# Patient Record
Sex: Female | Born: 1990 | Race: Black or African American | Hispanic: No | Marital: Single | Smoking: Never smoker
Health system: Southern US, Community
[De-identification: ages and names within clinical notes are randomized; demographics above are authoritative.]

## PROBLEM LIST (undated history)

## (undated) DIAGNOSIS — T7840XA Allergy, unspecified, initial encounter: Secondary | ICD-10-CM

## (undated) DIAGNOSIS — F32A Depression, unspecified: Secondary | ICD-10-CM

## (undated) DIAGNOSIS — Z8489 Family history of other specified conditions: Secondary | ICD-10-CM

## (undated) DIAGNOSIS — D649 Anemia, unspecified: Secondary | ICD-10-CM

## (undated) DIAGNOSIS — F419 Anxiety disorder, unspecified: Secondary | ICD-10-CM

## (undated) DIAGNOSIS — K8020 Calculus of gallbladder without cholecystitis without obstruction: Secondary | ICD-10-CM

## (undated) HISTORY — DX: Anemia, unspecified: D64.9

## (undated) HISTORY — DX: Depression, unspecified: F32.A

## (undated) HISTORY — DX: Anxiety disorder, unspecified: F41.9

## (undated) HISTORY — DX: Allergy, unspecified, initial encounter: T78.40XA

---

## 2008-01-02 ENCOUNTER — Ambulatory Visit: Payer: Self-pay | Admitting: Family Medicine

## 2008-01-02 DIAGNOSIS — E669 Obesity, unspecified: Secondary | ICD-10-CM

## 2008-01-02 DIAGNOSIS — N926 Irregular menstruation, unspecified: Secondary | ICD-10-CM | POA: Insufficient documentation

## 2008-01-04 ENCOUNTER — Encounter: Payer: Self-pay | Admitting: Family Medicine

## 2008-01-04 ENCOUNTER — Ambulatory Visit (HOSPITAL_COMMUNITY): Admission: RE | Admit: 2008-01-04 | Discharge: 2008-01-04 | Payer: Self-pay | Admitting: Family Medicine

## 2008-01-06 ENCOUNTER — Encounter: Payer: Self-pay | Admitting: Family Medicine

## 2008-01-06 LAB — CONVERTED CEMR LAB
Basophils Absolute: 0 10*3/uL (ref 0.0–0.1)
Basophils Relative: 0 % (ref 0–1)
CO2: 24 meq/L (ref 19–32)
Creatinine, Ser: 0.89 mg/dL (ref 0.40–1.20)
Eosinophils Absolute: 0.2 10*3/uL (ref 0.0–0.7)
Eosinophils Relative: 3 % (ref 0–5)
HCT: 35.8 % — ABNORMAL LOW (ref 36.0–46.0)
HDL: 47 mg/dL (ref >34)
Lymphocytes Relative: 40 % (ref 12–46)
Monocytes Absolute: 0.6 10*3/uL (ref 0.1–1.0)
Monocytes Relative: 10 % (ref 3–12)
Potassium: 4.6 meq/L (ref 3.5–5.3)
RBC: 3.9 M/uL (ref 3.87–5.11)
Total CHOL/HDL Ratio: 3.7
Triglycerides: 98 mg/dL (ref <150)
VLDL: 20 mg/dL (ref 0–40)
WBC: 5.4 10*3/uL (ref 4.0–10.5)

## 2009-05-24 ENCOUNTER — Emergency Department (HOSPITAL_COMMUNITY): Admission: EM | Admit: 2009-05-24 | Discharge: 2009-05-24 | Payer: Self-pay | Admitting: Emergency Medicine

## 2009-09-20 IMAGING — US US TRANSVAGINAL NON-OB
1 series · 14 of 25 positions shown · non-contrast
Comparison: None

CLINICAL DATA: Irregular menstrual cycle.  Polycystic ovarian
syndrome.

TRANSABDOMINAL AND TRANSVAGINAL ULTRASOUND OF PELVIS
TECHNIQUE: Both transabdominal and transvaginal ultrasound
examinations of the pelvis were performed including evaluation of
the uterus, ovaries, adnexal regions, and pelvic cul-de-sac.

[Series 1: us transvaginal non-ob · 0.26mm/px · 14 of 61 slices shown]
[im 1/61]
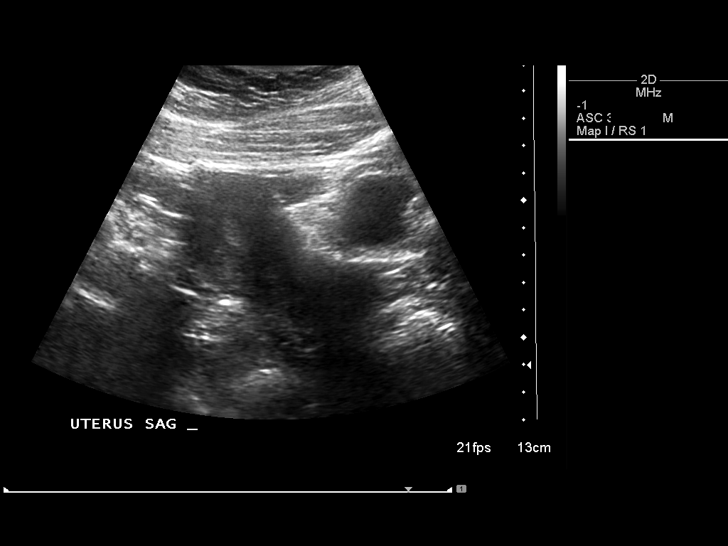
[im 6/61]
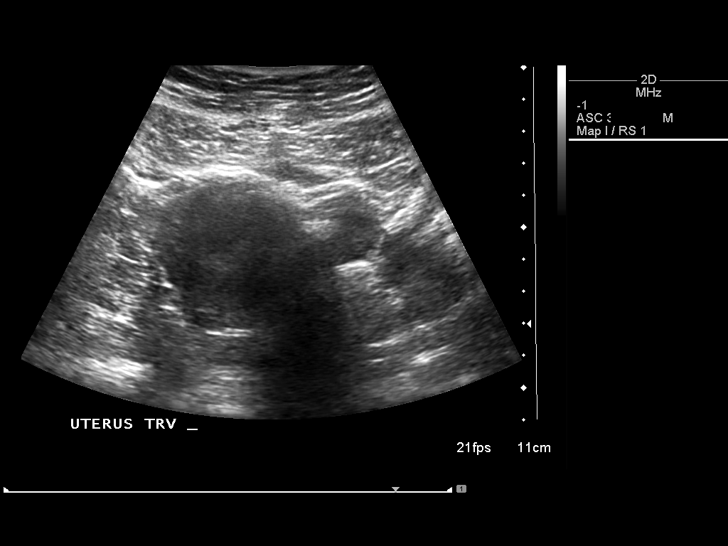
[im 11/61]
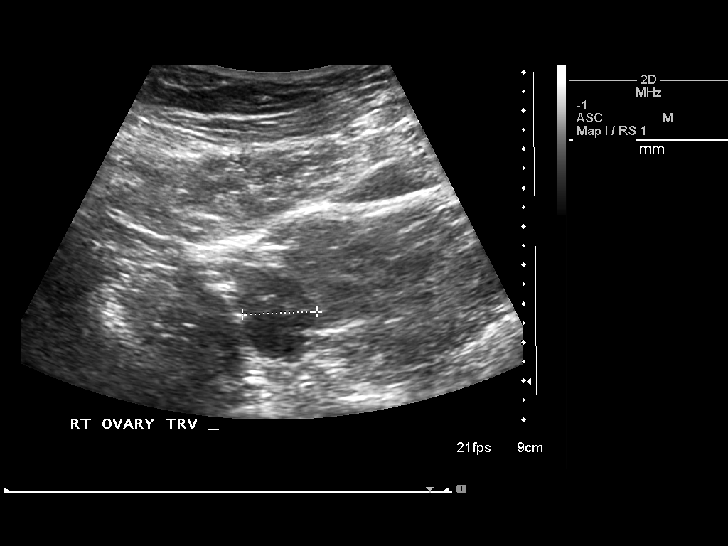
[im 16/61]
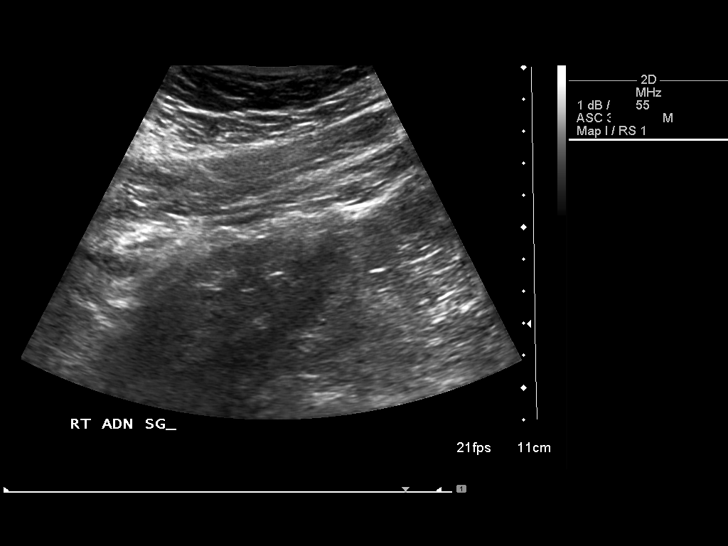
[im 21/61]
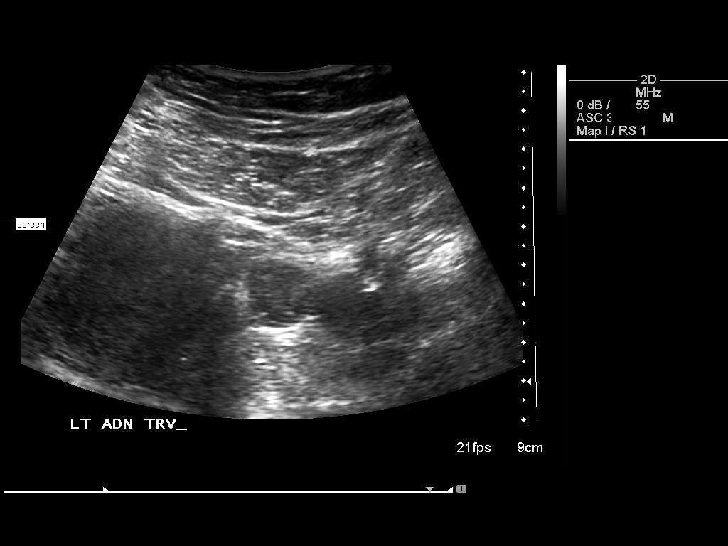
[im 23/61]
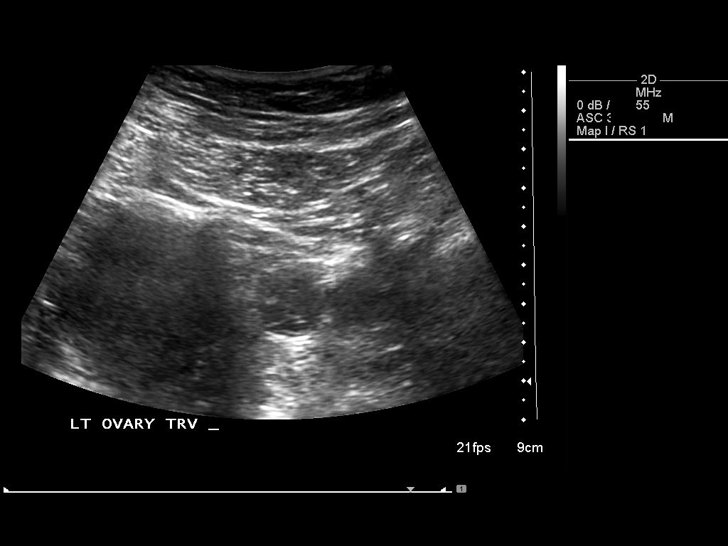
[im 28/61]
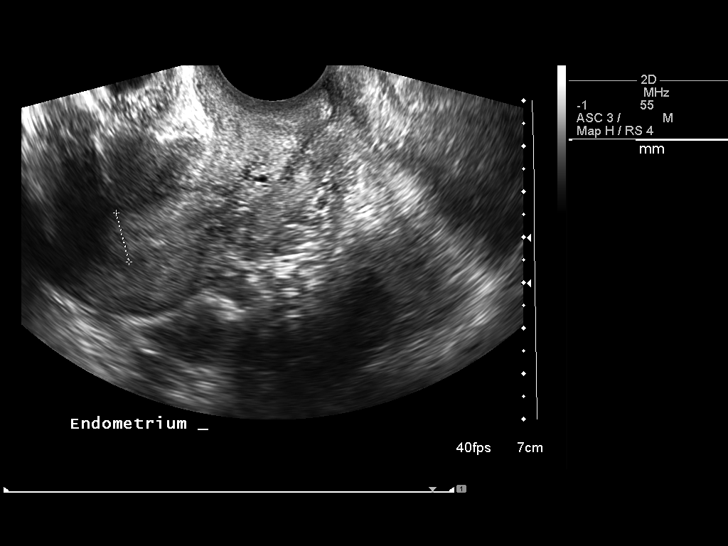
[im 33/61]
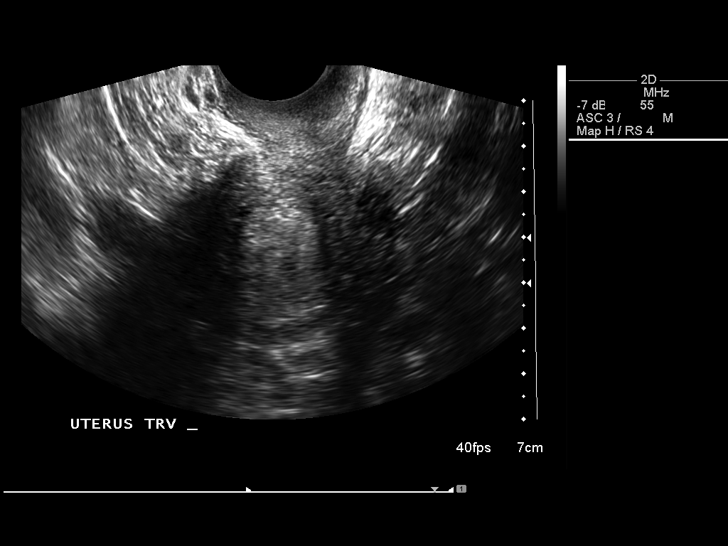
[im 38/61]
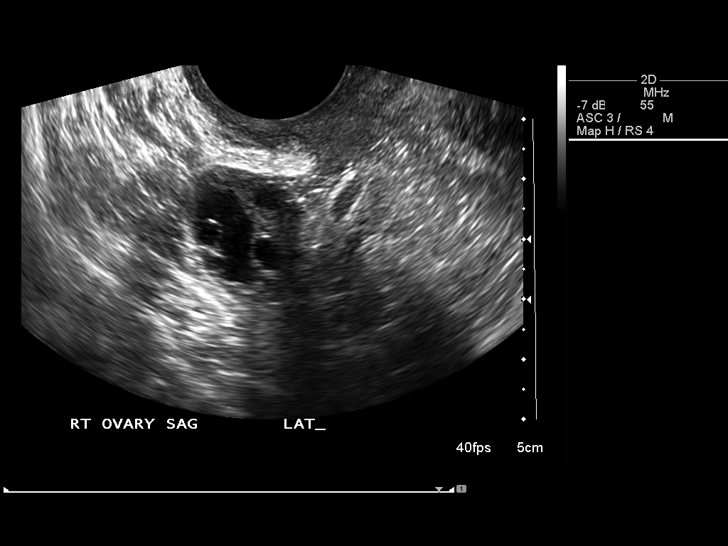
[im 41/61]
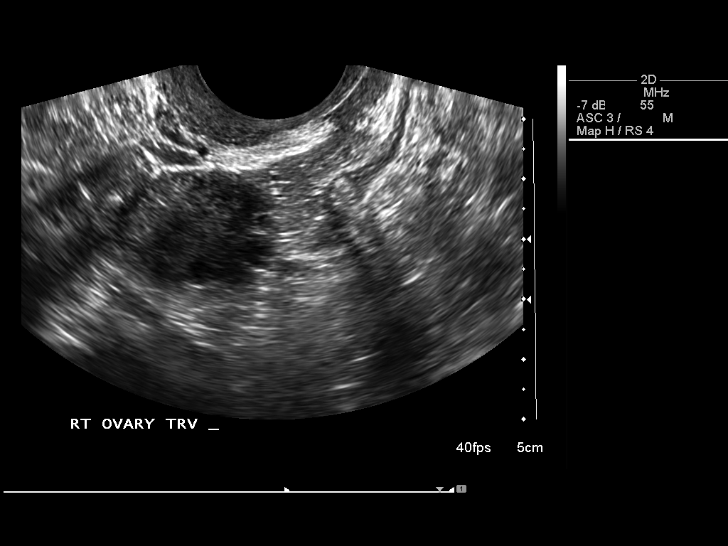
[im 46/61]
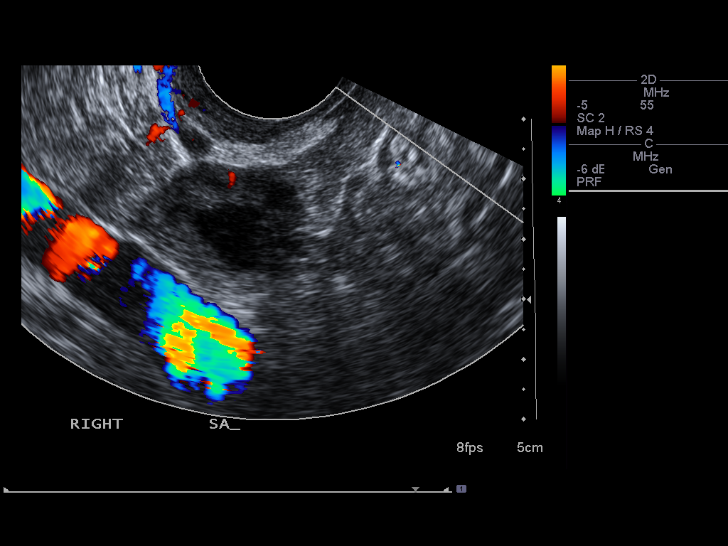
[im 51/61]
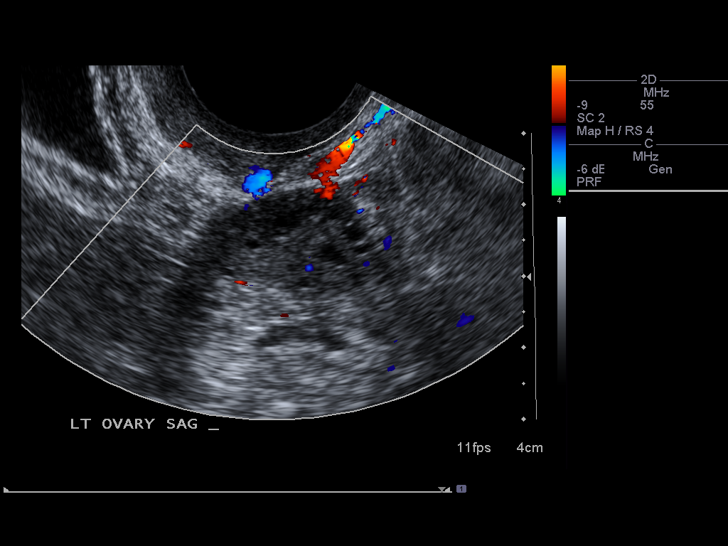
[im 56/61]
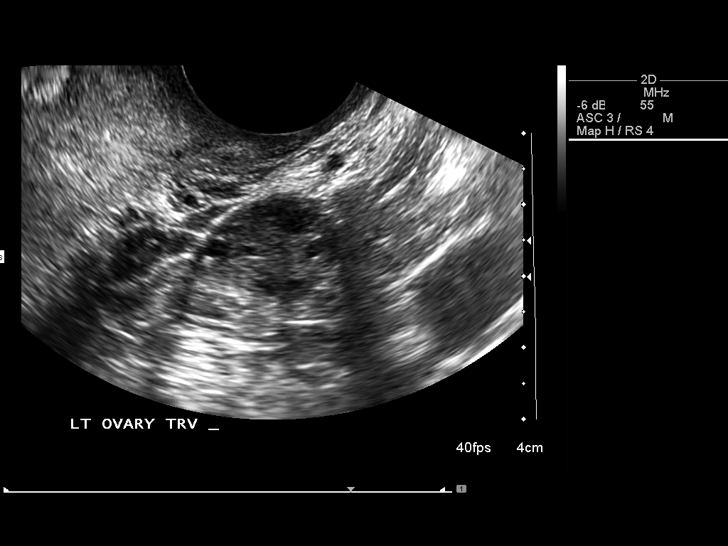
[im 61/61]
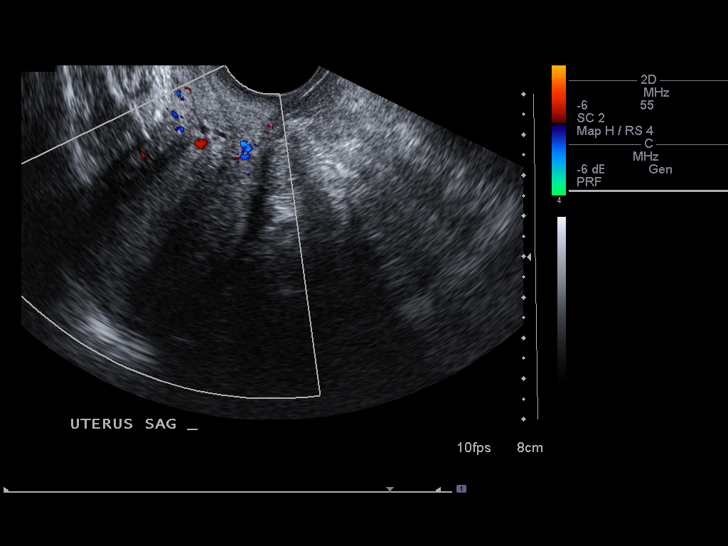

[14 of 25 positions shown; findings below may reference images not displayed]

FINDINGS: The uterus appears normal measuring 7.6 x 4.3 x 5.0 cm.

The endometrium appears normal measuring measuring 11 mm.

No intrauterine filling defects noted.

The left ovary is normal measuring 2.6 x 1.6 x 1.6 cm.

The right ovary measures 2.5 x 1.9 x 2.5 cm.

Minimally complex cyst within the right ovary measures 1.9 x 0.8 x
1.0 cm.

There is no free fluid within the pelvis.
IMPRESSION: 1.Mildly complex cyst within the right ovary measures 1.9 x 0.8 x
1.0 cm.

2.  The examination is otherwise unremarkable.

## 2010-08-05 ENCOUNTER — Other Ambulatory Visit: Payer: Self-pay | Admitting: Nurse Practitioner

## 2010-08-05 ENCOUNTER — Other Ambulatory Visit (HOSPITAL_COMMUNITY)
Admission: RE | Admit: 2010-08-05 | Discharge: 2010-08-05 | Disposition: A | Discharge status: Home / Self Care | Payer: Medicaid Other | Source: Ambulatory Visit | Attending: Unknown Physician Specialty | Admitting: Unknown Physician Specialty

## 2010-08-05 DIAGNOSIS — N870 Mild cervical dysplasia: Secondary | ICD-10-CM | POA: Insufficient documentation

## 2010-08-05 DIAGNOSIS — R87612 Low grade squamous intraepithelial lesion on cytologic smear of cervix (LGSIL): Secondary | ICD-10-CM | POA: Insufficient documentation

## 2010-08-05 DIAGNOSIS — B977 Papillomavirus as the cause of diseases classified elsewhere: Secondary | ICD-10-CM | POA: Insufficient documentation

## 2010-09-03 ENCOUNTER — Emergency Department (HOSPITAL_COMMUNITY)
Admission: EM | Admit: 2010-09-03 | Discharge: 2010-09-04 | Disposition: A | Discharge status: Home / Self Care | Payer: Medicaid Other | Attending: Emergency Medicine | Admitting: Emergency Medicine

## 2010-09-03 DIAGNOSIS — R109 Unspecified abdominal pain: Secondary | ICD-10-CM | POA: Insufficient documentation

## 2010-09-03 LAB — DIFFERENTIAL
Eosinophils Relative: 1 % (ref 0–5)
Lymphocytes Relative: 36 % (ref 12–46)
Neutro Abs: 3.6 10*3/uL (ref 1.7–7.7)
Neutrophils Relative %: 53 % (ref 43–77)

## 2010-09-03 LAB — CBC
HCT: 35 % — ABNORMAL LOW (ref 36.0–46.0)
MCH: 29.2 pg (ref 26.0–34.0)
MCHC: 32.6 g/dL (ref 30.0–36.0)

## 2010-09-03 LAB — HEPATIC FUNCTION PANEL
ALT: 63 U/L — ABNORMAL HIGH (ref 0–35)
Alkaline Phosphatase: 124 U/L — ABNORMAL HIGH (ref 39–117)
Bilirubin, Direct: 0.1 mg/dL (ref 0.0–0.3)
Total Bilirubin: 0.2 mg/dL — ABNORMAL LOW (ref 0.3–1.2)

## 2010-09-03 LAB — BASIC METABOLIC PANEL
Chloride: 102 mEq/L (ref 96–112)
Glucose, Bld: 88 mg/dL (ref 70–99)
Sodium: 138 mEq/L (ref 135–145)

## 2010-09-03 LAB — URINALYSIS, ROUTINE W REFLEX MICROSCOPIC
Bilirubin Urine: NEGATIVE
Glucose, UA: NEGATIVE mg/dL
Hgb urine dipstick: NEGATIVE
Ketones, ur: NEGATIVE mg/dL
Nitrite: NEGATIVE
Specific Gravity, Urine: 1.015 (ref 1.005–1.030)
pH: 8.5 — ABNORMAL HIGH (ref 5.0–8.0)

## 2010-09-03 LAB — POCT PREGNANCY, URINE: Preg Test, Ur: NEGATIVE

## 2010-09-04 ENCOUNTER — Ambulatory Visit (HOSPITAL_COMMUNITY)
Admit: 2010-09-04 | Discharge: 2010-09-04 | Disposition: A | Discharge status: Home / Self Care | Payer: Medicaid Other | Source: Ambulatory Visit | Attending: Emergency Medicine | Admitting: Emergency Medicine

## 2010-09-04 DIAGNOSIS — K838 Other specified diseases of biliary tract: Secondary | ICD-10-CM | POA: Insufficient documentation

## 2010-09-04 DIAGNOSIS — R109 Unspecified abdominal pain: Secondary | ICD-10-CM | POA: Insufficient documentation

## 2010-09-04 DIAGNOSIS — R932 Abnormal findings on diagnostic imaging of liver and biliary tract: Secondary | ICD-10-CM | POA: Insufficient documentation

## 2011-06-23 ENCOUNTER — Other Ambulatory Visit (HOSPITAL_COMMUNITY)
Admission: RE | Admit: 2011-06-23 | Discharge: 2011-06-23 | Disposition: A | Discharge status: Home / Self Care | Payer: Medicaid Other | Source: Ambulatory Visit | Attending: Unknown Physician Specialty | Admitting: Unknown Physician Specialty

## 2011-06-23 ENCOUNTER — Other Ambulatory Visit: Payer: Self-pay | Admitting: Nurse Practitioner

## 2011-06-23 DIAGNOSIS — R87612 Low grade squamous intraepithelial lesion on cytologic smear of cervix (LGSIL): Secondary | ICD-10-CM | POA: Insufficient documentation

## 2011-06-23 DIAGNOSIS — R87619 Unspecified abnormal cytological findings in specimens from cervix uteri: Secondary | ICD-10-CM | POA: Insufficient documentation

## 2012-07-24 ENCOUNTER — Encounter (HOSPITAL_COMMUNITY): Payer: Self-pay | Admitting: Emergency Medicine

## 2012-07-24 ENCOUNTER — Emergency Department (HOSPITAL_COMMUNITY)
Admission: EM | Admit: 2012-07-24 | Discharge: 2012-07-24 | Disposition: A | Discharge status: Home / Self Care | Payer: Self-pay | Attending: Emergency Medicine | Admitting: Emergency Medicine

## 2012-07-24 ENCOUNTER — Emergency Department (HOSPITAL_COMMUNITY): Payer: Self-pay

## 2012-07-24 DIAGNOSIS — K8020 Calculus of gallbladder without cholecystitis without obstruction: Secondary | ICD-10-CM | POA: Insufficient documentation

## 2012-07-24 DIAGNOSIS — Z3202 Encounter for pregnancy test, result negative: Secondary | ICD-10-CM | POA: Insufficient documentation

## 2012-07-24 DIAGNOSIS — R112 Nausea with vomiting, unspecified: Secondary | ICD-10-CM | POA: Insufficient documentation

## 2012-07-24 DIAGNOSIS — K8050 Calculus of bile duct without cholangitis or cholecystitis without obstruction: Secondary | ICD-10-CM

## 2012-07-24 DIAGNOSIS — E669 Obesity, unspecified: Secondary | ICD-10-CM | POA: Insufficient documentation

## 2012-07-24 HISTORY — DX: Calculus of gallbladder without cholecystitis without obstruction: K80.20

## 2012-07-24 LAB — CBC WITH DIFFERENTIAL/PLATELET
HCT: 32.6 % — ABNORMAL LOW (ref 36.0–46.0)
Hemoglobin: 10.4 g/dL — ABNORMAL LOW (ref 12.0–15.0)
Lymphocytes Relative: 37 % (ref 12–46)
MCH: 26.3 pg (ref 26.0–34.0)
MCHC: 31.9 g/dL (ref 30.0–36.0)
Monocytes Absolute: 0.5 10*3/uL (ref 0.1–1.0)
Monocytes Relative: 8 % (ref 3–12)
Neutrophils Relative %: 53 % (ref 43–77)
Platelets: 306 10*3/uL (ref 150–400)
RBC: 3.95 MIL/uL (ref 3.87–5.11)
WBC: 6.8 10*3/uL (ref 4.0–10.5)

## 2012-07-24 LAB — POCT PREGNANCY, URINE: Preg Test, Ur: NEGATIVE

## 2012-07-24 LAB — URINALYSIS, ROUTINE W REFLEX MICROSCOPIC
Ketones, ur: NEGATIVE mg/dL
Specific Gravity, Urine: >1.03 — ABNORMAL HIGH (ref 1.005–1.030)
Urobilinogen, UA: 0.2 mg/dL (ref 0.0–1.0)

## 2012-07-24 LAB — COMPREHENSIVE METABOLIC PANEL
ALT: 11 U/L (ref 0–35)
AST: 15 U/L (ref 0–37)
Alkaline Phosphatase: 83 U/L (ref 39–117)
CO2: 25 mEq/L (ref 19–32)
Calcium: 9.4 mg/dL (ref 8.4–10.5)
GFR calc Af Amer: >90 mL/min (ref >90)
GFR calc non Af Amer: >90 mL/min (ref >90)
Potassium: 3.6 mEq/L (ref 3.5–5.1)
Sodium: 137 mEq/L (ref 135–145)

## 2012-07-24 LAB — LIPASE, BLOOD: Lipase: 23 U/L (ref 11–59)

## 2012-07-24 LAB — URINE MICROSCOPIC-ADD ON

## 2012-07-24 MED ORDER — METOCLOPRAMIDE HCL 10 MG PO TABS: 10.0000 mg | ORAL_TABLET | Freq: Four times a day (QID) | ORAL | Status: DC | PRN

## 2012-07-24 MED ORDER — ONDANSETRON HCL 4 MG/2ML IJ SOLN
4.0000 mg | Freq: Once | INTRAMUSCULAR | Status: AC
  Administered 2012-07-24: 4 mg via INTRAVENOUS
  Filled 2012-07-24: qty 2

## 2012-07-24 MED ORDER — OXYCODONE-ACETAMINOPHEN 5-325 MG PO TABS: 1.0000 | ORAL_TABLET | ORAL | Status: DC | PRN

## 2012-07-24 MED ORDER — HYDROMORPHONE HCL PF 1 MG/ML IJ SOLN
1.0000 mg | Freq: Once | INTRAMUSCULAR | Status: AC
  Administered 2012-07-24: 1 mg via INTRAVENOUS
  Filled 2012-07-24: qty 1

## 2012-07-24 NOTE — ED Provider Notes (Signed)
History     CSN: [id number]  Arrival date & time 07/24/12  0543   First MD Initiated Contact with Patient 07/24/12 [phone]      Chief Complaint  Patient presents with  . Abdominal Pain     Patient is a 22 y.o. female presenting with abdominal pain. The history is provided by the patient.  Abdominal Pain This is a recurrent problem. The current episode started yesterday. The problem occurs constantly. The problem has been gradually worsening. Associated symptoms include abdominal pain. Pertinent negatives include no chest pain. Exacerbated by: palpation. Nothing relieves the symptoms. She has tried rest for the symptoms. The treatment provided no relief.  pt reports she has h/o gallstones She reports she has had recent increase in pain in RUQ and due to her pain she can not eat She reports nausea/vomiting She reports the abdominal pain will radiate into her back when pain is severe    Past Medical History  Diagnosis Date  . Gall stones     History reviewed. No pertinent past surgical history.  No family history on file.  History  Substance Use Topics  . Smoking status: Never Smoker   . Smokeless tobacco: Not on file  . Alcohol Use: No    OB History   Grav Para Term Preterm Abortions TAB SAB Ect Mult Living                  Review of Systems  Constitutional: Negative for fever.  Cardiovascular: Negative for chest pain.  Gastrointestinal: Positive for nausea, vomiting and abdominal pain. Negative for diarrhea.  Genitourinary: Negative for dysuria, vaginal bleeding and vaginal discharge.  Neurological: Negative for weakness.  All other systems reviewed and are negative.    Allergies  Review of patient's allergies indicates no known allergies.  Home Medications  No current outpatient prescriptions on file.  BP 126/80  Pulse 88  Temp(Src) 98 F (36.7 C) (Oral)  Resp 20  Ht 5\' 3"  (1.6 m)  Wt 300 lb (136.079 kg)  BMI 53.16 kg/m2  SpO2 97%  LMP  07/18/2012  Physical Exam CONSTITUTIONAL: Well developed/well nourished, anxious, uncomfortable HEAD: Normocephalic/atraumatic EYES: EOMI/PERRL, no scleral icterus noted ENMT: Mucous membranes moist NECK: supple no meningeal signs SPINE:entire spine nontender CV: S1/S2 noted, no murmurs/rubs/gallops noted LUNGS: Lungs are clear to auscultation bilaterally, no apparent distress ABDOMEN: soft, RUQ tenderness is noted, tenderness is moderate, no rebound or guarding. She is obese GU:no cva tenderness NEURO: Pt is awake/alert, moves all extremitiesx4 EXTREMITIES: pulses normal, full ROM SKIN: warm, color normal PSYCH: no abnormalities of mood noted  ED Course  Procedures  Labs Reviewed  COMPREHENSIVE METABOLIC PANEL  CBC WITH DIFFERENTIAL  LIPASE, BLOOD  URINALYSIS, ROUTINE W REFLEX MICROSCOPIC   6:41 AM Pt with h/o cholelithiasis presenting with worsening pain and nausea/vomiting Will treat pain and reassess 7:23 AM Labs reassuring, however still has RUQ tenderness despite pain meds Will proceed with RUQ ultrasound Pt agreeable D/w dr Preston Fleeting who will f/u on [place] results  MDM  Nursing notes including past medical history and social history reviewed and considered in documentation Labs/vital reviewed and considered         Joya Gaskins, MD 07/24/12 [phone]

## 2012-07-24 NOTE — ED Notes (Signed)
Pt c/o RUQ abdominal pain. States she has recurring bouts of gallstones. Reports that the pain has been coming and going for the last few days and "lasts for about three hours".

## 2012-07-24 NOTE — ED Provider Notes (Signed)
Patient initially evaluated by Dr. Bebe Shaggy who ordered abdominal ultrasound. Ultrasound shows cholelithiasis with thickened gallbladder wall but no pericholecystic fluid negative sonographic Murphy's sign. WBC is normal as are liver enzymes. Patient is reevaluated. She is sleeping and has to be awakened but when awakened states that she still having pain. Abdomen is soft with mild tenderness in the right upper quadrant. She is discharged with prescriptions for Percocet and metoclopramide and is referred to general surgery for followup. Patient had been referred to Gen. surgery before but states she did not followup because of financial reasons. She was advised that the longer she waits to have her gallbladder taken out, the more episodes of acute biliary colic she will have.  Results for orders placed during the hospital encounter of 07/24/12  COMPREHENSIVE METABOLIC PANEL      Result Value Range   Sodium 137  135 - 145 mEq/L   Potassium 3.6  3.5 - 5.1 mEq/L   Chloride 100  96 - 112 mEq/L   CO2 25  19 - 32 mEq/L   Glucose, Bld 124 (*) 70 - 99 mg/dL   BUN 12  6 - 23 mg/dL   Creatinine, Ser 1.61  0.50 - 1.10 mg/dL   Calcium 9.4  8.4 - 09.6 mg/dL   Total Protein 7.6  6.0 - 8.3 g/dL   Albumin 3.6  3.5 - 5.2 g/dL   AST 15  0 - 37 U/L   ALT 11  0 - 35 U/L   Alkaline Phosphatase 83  39 - 117 U/L   Total Bilirubin 0.1 (*) 0.3 - 1.2 mg/dL   GFR calc non Af Amer >90  >90 mL/min   GFR calc Af Amer >90  >90 mL/min  CBC WITH DIFFERENTIAL      Result Value Range   WBC 6.8  4.0 - 10.5 K/uL   RBC 3.95  3.87 - 5.11 MIL/uL   Hemoglobin 10.4 (*) 12.0 - 15.0 g/dL   HCT 04.5 (*) 40.9 - 81.1 %   MCV 82.5  78.0 - 100.0 fL   MCH 26.3  26.0 - 34.0 pg   MCHC 31.9  30.0 - 36.0 g/dL   RDW 91.4 (*) 78.2 - 95.6 %   Platelets 306  150 - 400 K/uL   Neutrophils Relative % 53  43 - 77 %   Neutro Abs 3.6  1.7 - 7.7 K/uL   Lymphocytes Relative 37  12 - 46 %   Lymphs Abs 2.5  0.7 - 4.0 K/uL   Monocytes Relative 8  3  - 12 %   Monocytes Absolute 0.5  0.1 - 1.0 K/uL   Eosinophils Relative 2  0 - 5 %   Eosinophils Absolute 0.1  0.0 - 0.7 K/uL   Basophils Relative 0  0 - 1 %   Basophils Absolute 0.0  0.0 - 0.1 K/uL  LIPASE, BLOOD      Result Value Range   Lipase 23  11 - 59 U/L  URINALYSIS, ROUTINE W REFLEX MICROSCOPIC      Result Value Range   Color, Urine YELLOW  YELLOW   APPearance CLEAR  CLEAR   Specific Gravity, Urine >1.030 (*) 1.005 - 1.030   pH 6.0  5.0 - 8.0   Glucose, UA NEGATIVE  NEGATIVE mg/dL   Hgb urine dipstick LARGE (*) NEGATIVE   Bilirubin Urine NEGATIVE  NEGATIVE   Ketones, ur NEGATIVE  NEGATIVE mg/dL   Protein, ur NEGATIVE  NEGATIVE mg/dL   Urobilinogen, UA  0.2  0.0 - 1.0 mg/dL   Nitrite NEGATIVE  NEGATIVE   Leukocytes, UA NEGATIVE  NEGATIVE  URINE MICROSCOPIC-ADD ON      Result Value Range   Squamous Epithelial / LPF MANY (*) RARE   WBC, UA 0-2  <3 WBC/hpf   RBC / HPF 0-2  <3 RBC/hpf   Bacteria, UA FEW (*) RARE   Urine-Other MUCOUS PRESENT    POCT PREGNANCY, URINE      Result Value Range   Preg Test, Ur NEGATIVE  NEGATIVE   US Abdomen Limited Ruq  07/24/2012   *RADIOLOGY REPORT*  Clinical Data:  Abdominal pain  LIMITED ABDOMINAL ULTRASOUND - RIGHT UPPER QUADRANT  Comparison:  Abdominal ultrasound - 09/04/2010  Findings:  Examination is degraded secondary to patient body habitus and poor sonographic window.  Gallbladder:  Multiple partially shadowing echogenic gallstones are again seen within the gallbladder (image 53).  The gallbladder wall again appears thickened measuring approximately 5 mm in diameter. No pericholecystic fluid.  Negative sonographic Murphy's sign.  Common bile duct:  Borderline enlarged, measuring 6.5 mm in diameter, previously, 7.7 mm.  Liver:  There is mild diffuse increase echogenicity of the hepatic parenchyma.  No discrete hepatic lesions.  No definite intrahepatic biliary duct dilatation.  No ascites.  IMPRESSION: 1.  Persistent findings of  cholelithiasis and gallbladder wall thickening without definite evidence of acute cholecystitis. Further evaluation with a HIDA scan may be obtained as clinically indicated. 2.  No change to slight decrease in size of borderline enlarged caliber of the common bile duct.            Original Report Authenticated By: Tacey Ruiz, MD      Dione Booze, MD 07/24/12 [phone]

## 2012-12-11 ENCOUNTER — Emergency Department (HOSPITAL_COMMUNITY)
Admission: EM | Admit: 2012-12-11 | Discharge: 2012-12-11 | Disposition: A | Discharge status: Home / Self Care | Payer: Medicaid Other | Attending: Emergency Medicine | Admitting: Emergency Medicine

## 2012-12-11 ENCOUNTER — Encounter (HOSPITAL_COMMUNITY): Payer: Self-pay | Admitting: Emergency Medicine

## 2012-12-11 DIAGNOSIS — W268XXA Contact with other sharp object(s), not elsewhere classified, initial encounter: Secondary | ICD-10-CM | POA: Insufficient documentation

## 2012-12-11 DIAGNOSIS — Y9389 Activity, other specified: Secondary | ICD-10-CM | POA: Insufficient documentation

## 2012-12-11 DIAGNOSIS — S81009A Unspecified open wound, unspecified knee, initial encounter: Secondary | ICD-10-CM | POA: Insufficient documentation

## 2012-12-11 DIAGNOSIS — Z8719 Personal history of other diseases of the digestive system: Secondary | ICD-10-CM | POA: Insufficient documentation

## 2012-12-11 DIAGNOSIS — S81811A Laceration without foreign body, right lower leg, initial encounter: Secondary | ICD-10-CM

## 2012-12-11 DIAGNOSIS — Y92009 Unspecified place in unspecified non-institutional (private) residence as the place of occurrence of the external cause: Secondary | ICD-10-CM | POA: Insufficient documentation

## 2012-12-11 MED ORDER — BACITRACIN ZINC 500 UNIT/GM EX OINT
TOPICAL_OINTMENT | CUTANEOUS | Status: AC
  Filled 2012-12-11: qty 0.9

## 2012-12-11 MED ORDER — LIDOCAINE-EPINEPHRINE (PF) 1 %-1:200000 IJ SOLN
10.0000 mL | Freq: Once | INTRAMUSCULAR | Status: DC
  Filled 2012-12-11: qty 10

## 2012-12-11 MED ORDER — TETANUS-DIPHTH-ACELL PERTUSSIS 5-2.5-18.5 LF-MCG/0.5 IM SUSP
0.5000 mL | Freq: Once | INTRAMUSCULAR | Status: AC
  Administered 2012-12-11: 0.5 mL via INTRAMUSCULAR
  Filled 2012-12-11: qty 0.5

## 2012-12-11 NOTE — ED Provider Notes (Signed)
CSN: [id number]     Arrival date & time 12/11/12  0434 History   First MD Initiated Contact with Patient 12/11/12 0434     Chief Complaint  Patient presents with  . Extremity Laceration   (Consider location/radiation/quality/duration/timing/severity/associated sxs/prior Treatment) HPI History provided by patient. At home tonight, cleaning up broken glass and unintentionally cut her right lower leg on a piece of glass sticking out of a box.  EMS was called for persistent bleeding and patient brought in with bleeding controlled prior to arrival. No foreign body sensation. Last tetanus shot greater than 5 years ago. Pain is now mild, sharp in quality without radiation. No right leg weakness or numbness. No other pain or injury. Past Medical History  Diagnosis Date  . Gall stones    History reviewed. No pertinent past surgical history. No family history on file. History  Substance Use Topics  . Smoking status: Never Smoker   . Smokeless tobacco: Not on file  . Alcohol Use: No   OB History   Grav Para Term Preterm Abortions TAB SAB Ect Mult Living                 Review of Systems  Constitutional: Negative for diaphoresis.  Respiratory: Negative for shortness of breath.   Cardiovascular: Negative for chest pain.  Gastrointestinal: Negative for nausea and vomiting.  Skin: Positive for wound.  Neurological: Negative for weakness and numbness.  All other systems reviewed and are negative.    Allergies  Review of patient's allergies indicates no known allergies.  Home Medications   Current Outpatient Rx  Name  Route  Sig  Dispense  Refill  . metoCLOPramide (REGLAN) 10 MG tablet   Oral   Take 1 tablet (10 mg total) by mouth every 6 (six) hours as needed (Nausea).   30 tablet   0   . oxyCODONE-acetaminophen (PERCOCET/ROXICET) 5-325 MG per tablet   Oral   Take 1 tablet by mouth every 4 (four) hours as needed for pain.   20 tablet   0    BP 117/87  Pulse 93  Temp(Src)  98.7 F (37.1 C) (Oral)  Resp 20  Ht 5\' 3"  (1.6 m)  SpO2 97%  LMP 12/05/2012 Physical Exam  Constitutional: She is oriented to person, place, and time. She appears well-developed and well-nourished.  HENT:  Head: Normocephalic and atraumatic.  Eyes: EOM are normal. Pupils are equal, round, and reactive to light.  Neck: Neck supple.  Cardiovascular: Regular rhythm and intact distal pulses.   Pulmonary/Chest: Effort normal. No respiratory distress.  Musculoskeletal: Normal range of motion.  Right lateral lower leg with approximately 3 cm full-thickness laceration, linear with moderate gape. Hemostatic. No foreign body. Distal neurovascular intact.  Neurological: She is alert and oriented to person, place, and time.  Skin: Skin is warm and dry.    ED Course  LACERATION REPAIR Date/Time: 12/11/2012 4:53 AM Performed by: Sunnie Nielsen Authorized by: Sunnie Nielsen Consent: Verbal consent obtained. Risks and benefits: risks, benefits and alternatives were discussed Consent given by: patient Patient understanding: patient states understanding of the procedure being performed Patient consent: the patient's understanding of the procedure matches consent given Procedure consent: procedure consent matches procedure scheduled Required items: required blood products, implants, devices, and special equipment available Patient identity confirmed: verbally with patient Body area: lower extremity Location details: right lower leg Laceration length: 3 cm Foreign bodies: no foreign bodies Vascular damage: no Anesthesia: local infiltration Local anesthetic: lidocaine 1% with epinephrine Anesthetic total:  4 ml Preparation: Patient was prepped and draped in the usual sterile fashion. Irrigation solution: saline Irrigation method: syringe Amount of cleaning: extensive Skin closure: 5-0 nylon Number of sutures: 3 Technique: simple Approximation: close Approximation difficulty: simple Dressing:  4x4 sterile gauze and antibiotic ointment Patient tolerance: Patient tolerated the procedure well with no immediate complications.   (including critical care time)   Tetanus shot provided  Wound irrigated extensively and repaired as above. Sterile bacitracin dressing applied.  Plan discharge home with suture removal 7 days. Infection precautions provided and verbalized as understood.   MDM   1. Laceration of right leg excluding thigh, initial encounter    Vital signs nursing notes reviewed and considered   Sunnie Nielsen, MD 12/11/12 [phone]

## 2012-12-11 NOTE — ED Notes (Signed)
While cleaning out a room, pt accidentally got cut on broken glass, right lateral  calf

## 2013-06-06 ENCOUNTER — Emergency Department (HOSPITAL_COMMUNITY): Payer: Medicaid Other

## 2013-06-06 ENCOUNTER — Encounter (HOSPITAL_COMMUNITY): Payer: Self-pay | Admitting: Emergency Medicine

## 2013-06-06 ENCOUNTER — Emergency Department (HOSPITAL_COMMUNITY)
Admission: EM | Admit: 2013-06-06 | Discharge: 2013-06-06 | Disposition: A | Discharge status: Home / Self Care | Payer: Self-pay | Attending: Emergency Medicine | Admitting: Emergency Medicine

## 2013-06-06 DIAGNOSIS — N390 Urinary tract infection, site not specified: Secondary | ICD-10-CM

## 2013-06-06 DIAGNOSIS — Z91040 Latex allergy status: Secondary | ICD-10-CM | POA: Insufficient documentation

## 2013-06-06 DIAGNOSIS — K8000 Calculus of gallbladder with acute cholecystitis without obstruction: Secondary | ICD-10-CM | POA: Insufficient documentation

## 2013-06-06 DIAGNOSIS — K8020 Calculus of gallbladder without cholecystitis without obstruction: Secondary | ICD-10-CM

## 2013-06-06 LAB — COMPREHENSIVE METABOLIC PANEL
ALBUMIN: 3.4 g/dL — AB (ref 3.5–5.2)
ALT: 17 U/L (ref 0–35)
AST: 20 U/L (ref 0–37)
Alkaline Phosphatase: 84 U/L (ref 39–117)
BUN: 11 mg/dL (ref 6–23)
CHLORIDE: 103 meq/L (ref 96–112)
CO2: 25 mEq/L (ref 19–32)
CREATININE: 0.93 mg/dL (ref 0.50–1.10)
Calcium: 9.2 mg/dL (ref 8.4–10.5)
GFR, EST NON AFRICAN AMERICAN: 85 mL/min — AB (ref >90)
Glucose, Bld: 112 mg/dL — ABNORMAL HIGH (ref 70–99)
Potassium: 4.5 mEq/L (ref 3.7–5.3)
Sodium: 141 mEq/L (ref 137–147)
Total Protein: 7.6 g/dL (ref 6.0–8.3)

## 2013-06-06 LAB — URINALYSIS, ROUTINE W REFLEX MICROSCOPIC
Bilirubin Urine: NEGATIVE
Glucose, UA: NEGATIVE mg/dL
KETONES UR: 15 mg/dL — AB
NITRITE: NEGATIVE
PH: 7 (ref 5.0–8.0)
PROTEIN: 100 mg/dL — AB
Specific Gravity, Urine: 1.031 — ABNORMAL HIGH (ref 1.005–1.030)
UROBILINOGEN UA: 1 mg/dL (ref 0.0–1.0)

## 2013-06-06 LAB — URINE MICROSCOPIC-ADD ON

## 2013-06-06 LAB — CBC WITH DIFFERENTIAL/PLATELET
BASOS PCT: 0 % (ref 0–1)
Basophils Absolute: 0 10*3/uL (ref 0.0–0.1)
Eosinophils Absolute: 0.2 10*3/uL (ref 0.0–0.7)
Eosinophils Relative: 3 % (ref 0–5)
HEMATOCRIT: 32.2 % — AB (ref 36.0–46.0)
HEMOGLOBIN: 9.9 g/dL — AB (ref 12.0–15.0)
Lymphocytes Relative: 43 % (ref 12–46)
Lymphs Abs: 2.9 10*3/uL (ref 0.7–4.0)
MCH: 25.1 pg — AB (ref 26.0–34.0)
MCHC: 30.7 g/dL (ref 30.0–36.0)
MCV: 81.5 fL (ref 78.0–100.0)
MONO ABS: 0.6 10*3/uL (ref 0.1–1.0)
MONOS PCT: 9 % (ref 3–12)
NEUTROS ABS: 3 10*3/uL (ref 1.7–7.7)
Neutrophils Relative %: 45 % (ref 43–77)
Platelets: 427 10*3/uL — ABNORMAL HIGH (ref 150–400)
RBC: 3.95 MIL/uL (ref 3.87–5.11)
RDW: 16.1 % — ABNORMAL HIGH (ref 11.5–15.5)
WBC: 6.7 10*3/uL (ref 4.0–10.5)

## 2013-06-06 LAB — POC URINE PREG, ED: Preg Test, Ur: NEGATIVE

## 2013-06-06 LAB — LIPASE, BLOOD: LIPASE: 29 U/L (ref 11–59)

## 2013-06-06 MED ORDER — SODIUM CHLORIDE 0.9 % IV BOLUS (SEPSIS)
1000.0000 mL | Freq: Once | INTRAVENOUS | Status: AC
  Administered 2013-06-06: 1000 mL via INTRAVENOUS

## 2013-06-06 MED ORDER — CIPROFLOXACIN HCL 500 MG PO TABS: 500.0000 mg | ORAL_TABLET | Freq: Two times a day (BID) | ORAL | Status: DC

## 2013-06-06 MED ORDER — ACETAMINOPHEN-CODEINE #3 300-30 MG PO TABS: 1.0000 | ORAL_TABLET | Freq: Four times a day (QID) | ORAL | Status: DC | PRN

## 2013-06-06 MED ORDER — MORPHINE SULFATE 4 MG/ML IJ SOLN
4.0000 mg | Freq: Once | INTRAMUSCULAR | Status: AC
  Administered 2013-06-06: 4 mg via INTRAVENOUS
  Filled 2013-06-06: qty 1

## 2013-06-06 NOTE — ED Provider Notes (Signed)
CSN: [id number]     Arrival date & time 06/06/13  0535 History   First MD Initiated Contact with Patient 06/06/13 [phone]     Chief Complaint  Patient presents with  . Abdominal Pain     (Consider location/radiation/quality/duration/timing/severity/associated sxs/prior Treatment) HPI Comments: Patient is a 23 year old female past medical history significant for cholelithiasis presenting to the emergency department for acute onset constant sharp right upper quadrant pain with radiation to back. Patient denies any alleviating factors. She states palpation aggravated her pain. She denies any fevers, chills, nausea, vomiting, diarrhea, urinary symptoms, vaginal complaints. Patient does not have any abdominal surgical history. Patient is currently on her menstrual cycle.   Patient is a 23 y.o. female presenting with abdominal pain.  Abdominal Pain Associated symptoms: no constipation, no diarrhea, no nausea and no vomiting     Past Medical History  Diagnosis Date  . Gall stones    History reviewed. No pertinent past surgical history. No family history on file. History  Substance Use Topics  . Smoking status: Never Smoker   . Smokeless tobacco: Not on file  . Alcohol Use: No   OB History   Grav Para Term Preterm Abortions TAB SAB Ect Mult Living                 Review of Systems  Gastrointestinal: Positive for abdominal pain. Negative for nausea, vomiting, diarrhea, constipation, blood in stool, abdominal distention, anal bleeding and rectal pain.  All other systems reviewed and are negative.      Allergies  Latex  Home Medications   Current Outpatient Rx  Name  Route  Sig  Dispense  Refill  . acetaminophen-codeine (TYLENOL #3) 300-30 MG per tablet   Oral   Take 1-2 tablets by mouth every 6 (six) hours as needed for severe pain.   15 tablet   0   . ciprofloxacin (CIPRO) 500 MG tablet   Oral   Take 1 tablet (500 mg total) by mouth 2 (two) times daily.   6 tablet   0     BP 125/72  Pulse 102  Temp(Src) 98.9 F (37.2 C) (Oral)  Resp 18  Ht 5\' 3"  (1.6 m)  Wt 305 lb (138.347 kg)  BMI 54.04 kg/m2  SpO2 99%  LMP 06/01/2013 Physical Exam  Nursing note and vitals reviewed. Constitutional: She is oriented to person, place, and time. She appears well-developed and well-nourished. No distress.  HENT:  Head: Normocephalic and atraumatic.  Right Ear: External ear normal.  Left Ear: External ear normal.  Nose: Nose normal.  Mouth/Throat: No oropharyngeal exudate.  Eyes: Conjunctivae are normal.  Neck: Neck supple.  Cardiovascular: Normal rate, regular rhythm and normal heart sounds.   Pulmonary/Chest: Effort normal and breath sounds normal.  Abdominal: Soft. Bowel sounds are normal. She exhibits no distension. There is tenderness in the right upper quadrant. There is no rigidity, no rebound, no guarding and negative Murphy's sign.  Musculoskeletal: Normal range of motion.  Neurological: She is alert and oriented to person, place, and time.  Skin: Skin is warm and dry. She is not diaphoretic.    ED Course  Procedures (including critical care time) Medications  morphine 4 MG/ML injection 4 mg (4 mg Intravenous Given 06/06/13 0753)  sodium chloride 0.9 % bolus 1,000 mL (0 mLs Intravenous Stopped 06/06/13 0950)    Labs Review Labs Reviewed  CBC WITH DIFFERENTIAL - Abnormal; Notable for the following:    Hemoglobin 9.9 (*)    HCT 32.2 (*)  MCH 25.1 (*)    RDW 16.1 (*)    Platelets 427 (*)    All other components within normal limits  COMPREHENSIVE METABOLIC PANEL - Abnormal; Notable for the following:    Glucose, Bld 112 (*)    Albumin 3.4 (*)    Total Bilirubin <0.2 (*)    GFR calc non Af Amer 85 (*)    All other components within normal limits  URINALYSIS, ROUTINE W REFLEX MICROSCOPIC - Abnormal; Notable for the following:    Color, Urine RED (*)    APPearance CLOUDY (*)    Specific Gravity, Urine 1.031 (*)    Hgb urine dipstick LARGE (*)     Ketones, ur 15 (*)    Protein, ur 100 (*)    Leukocytes, UA MODERATE (*)    All other components within normal limits  URINE MICROSCOPIC-ADD ON - Abnormal; Notable for the following:    Squamous Epithelial / LPF FEW (*)    Bacteria, UA MANY (*)    All other components within normal limits  LIPASE, BLOOD  POC URINE PREG, ED   Imaging Review US Abdomen Complete  06/06/2013   CLINICAL DATA:  Right upper quadrant pain.  EXAM: ULTRASOUND ABDOMEN COMPLETE  COMPARISON:  US ABDOMEN COMPLETE dated 09/04/2010  FINDINGS: Gallbladder:  Numerous gallstones fill the gallbladder. Gallbladder wall is upper limits of normal at 2.5 mm. Negative sonographic Murphy's sign. No pericholecystic fluid.  Common bile duct:  Diameter: Dilated, measuring 10 mm, slightly increased from 8 mm previously. The distal common bile duct cannot be visualized due to overlying bowel gas.  Liver:  Previously seen echogenic focus within the right hepatic dome is not visualized on today's study. Mildly increased echotexture may reflect mild fatty infiltration. No visible intrahepatic biliary ductal dilatation.  IVC:  No abnormality visualized.  Pancreas:  Visualized portion unremarkable.  Spleen:  Size and appearance within normal limits.  Right Kidney:  Length: 10.4 cm. Echogenicity within normal limits. No mass or hydronephrosis visualized.  Left Kidney:  Length: 11.4 cm. Echogenicity within normal limits. No mass or hydronephrosis visualized.  Abdominal aorta:  No aneurysm visualized.  Other findings:  None.  IMPRESSION: Numerous gallstones within the gallbladder. No definite sonographic evidence of acute cholecystitis. However, the common bile duct is dilated, measuring 10 mm. The distal common bile duct cannot be visualized to exclude distal CBD stones. Recommend correlation with LFTs and MRCP if further evaluation is felt warranted.   Electronically Signed   By: Rolm Baptise M.D.   On: 06/06/2013 09:12     EKG Interpretation None       MDM   Final diagnoses:  Cholelithiasis  UTI (lower urinary tract infection)    Filed Vitals:   06/06/13 0950  BP: 125/72  Pulse: 102  Temp:   Resp: 18   Afebrile, NAD, non-toxic appearing, AAOx4. Abdomen is soft, mildly tender in right upper quadrant guarding, rigidity or rebound. Labs reviewed. Ultrasound reveals numerous gallstones. No evidence of cholecystitis. Common bile duct is noted to be mildly dilated, however there are no LFT abnormalities unlikely that there is a stone blocking the common bile duct at this time. Patient also noted to have a urinary tract infection. No CVA tenderness. Will refer patient to Gen. surgery to discuss recurrent gallstones. Will treat UTI with antibiotics. Pain medication will be given. Return precautions discussed. Patient agreeable to plan. Patient stable at time of discharge.      Harlow Mares, PA-C 06/06/13 1053

## 2013-06-06 NOTE — ED Notes (Signed)
PA at bedside.

## 2013-06-06 NOTE — ED Notes (Signed)
Patient transported to Ultrasound 

## 2013-06-06 NOTE — ED Notes (Signed)
Returned from U/S

## 2013-06-06 NOTE — ED Notes (Signed)
Pt. reports right upper abdominal pain radiating to right flank onset this morning , no nausea /vomitting or diarrhea. Pt.stated history of gallstones . Denies fever or chills.

## 2013-06-06 NOTE — Discharge Instructions (Signed)
Please follow up with your primary care physician in 1-2 days. If you do not have one please call the [facility] number listed above. Please follow up with general surgery to schedule a follow up appointment. Please take pain medication and/or muscle relaxants as prescribed and as needed for pain. Please do not drive on narcotic pain medication or on muscle relaxants. Please take pain medication and/or muscle relaxants as prescribed and as needed for pain. Please do not drive on narcotic pain medication or on muscle relaxants. Please take your antibiotic until completion. Please read all discharge instructions and return precautions.    Cholelithiasis Cholelithiasis (also called gallstones) is a form of gallbladder disease in which gallstones form in your gallbladder. The gallbladder is an organ that stores bile made in the liver, which helps digest fats. Gallstones begin as small crystals and slowly grow into stones. Gallstone pain occurs when the gallbladder spasms and a gallstone is blocking the duct. Pain can also occur when a stone passes out of the duct.  RISK FACTORS  Being female.   Having multiple pregnancies. Health care providers sometimes advise removing diseased gallbladders before future pregnancies.   Being obese.  Eating a diet heavy in fried foods and fat.   Being older than 74 years and increasing age.   Prolonged use of medicines containing female hormones.   Having diabetes mellitus.   Rapidly losing weight.   Having a family history of gallstones (heredity).  SYMPTOMS  Nausea.   Vomiting.  Abdominal pain.   Yellowing of the skin (jaundice).   Sudden pain. It may persist from several minutes to several hours.  Fever.   Tenderness to the touch. In some cases, when gallstones do not move into the bile duct, people have no pain or symptoms. These are called "silent" gallstones.  TREATMENT Silent gallstones do not need  treatment. In severe cases, emergency surgery may be required. Options for treatment include:  Surgery to remove the gallbladder. This is the most common treatment.  Medicines. These do not always work and may take 6 12 months or more to work.  Shock wave treatment (extracorporeal biliary lithotripsy). In this treatment an ultrasound machine sends shock waves to the gallbladder to break gallstones into smaller pieces that can pass into the intestines or be dissolved by medicine. HOME CARE INSTRUCTIONS   Only take over-the-counter or prescription medicines for pain, discomfort, or fever as directed by your health care provider.   Follow a low-fat diet until seen again by your health care provider. Fat causes the gallbladder to contract, which can result in pain.   Follow up with your health care provider as directed. Attacks are almost always recurrent and surgery is usually required for permanent treatment.  SEEK IMMEDIATE MEDICAL CARE IF:   Your pain increases and is not controlled by medicines.   You have a fever or persistent symptoms for more than 2 3 days.   You have a fever and your symptoms suddenly get worse.   You have persistent nausea and vomiting.  MAKE SURE YOU:   Understand these instructions.  Will watch your condition.  Will get help right away if you are not doing well or get worse. Document Released: 02/12/2005 Document Revised: 10/19/2012 Document Reviewed: 08/10/2012 [facility] Patient Information 2014 [place].    Urinary Tract Infection Urinary tract infections (UTIs) can develop anywhere along your urinary tract. Your urinary tract is your body's drainage system for removing wastes and extra water. Your  urinary tract includes two kidneys, two ureters, a bladder, and a urethra. Your kidneys are a pair of bean-shaped organs. Each kidney is about the size of your fist. They are located below your ribs, one on each side of your  spine. CAUSES Infections are caused by microbes, which are microscopic organisms, including fungi, viruses, and bacteria. These organisms are so small that they can only be seen through a microscope. Bacteria are the microbes that most commonly cause UTIs. SYMPTOMS  Symptoms of UTIs may vary by age and gender of the patient and by the location of the infection. Symptoms in young women typically include a frequent and intense urge to urinate and a painful, burning feeling in the bladder or urethra during urination. Older women and men are more likely to be tired, shaky, and weak and have muscle aches and abdominal pain. A fever may mean the infection is in your kidneys. Other symptoms of a kidney infection include pain in your back or sides below the ribs, nausea, and vomiting. DIAGNOSIS To diagnose a UTI, your caregiver will ask you about your symptoms. Your caregiver also will ask to provide a urine sample. The urine sample will be tested for bacteria and white blood cells. White blood cells are made by your body to help fight infection. TREATMENT  Typically, UTIs can be treated with medication. Because most UTIs are caused by a bacterial infection, they usually can be treated with the use of antibiotics. The choice of antibiotic and length of treatment depend on your symptoms and the type of bacteria causing your infection. HOME CARE INSTRUCTIONS  If you were prescribed antibiotics, take them exactly as your caregiver instructs you. Finish the medication even if you feel better after you have only taken some of the medication.  Drink enough water and fluids to keep your urine clear or pale yellow.  Avoid caffeine, tea, and carbonated beverages. They tend to irritate your bladder.  Empty your bladder often. Avoid holding urine for long periods of time.  Empty your bladder before and after sexual intercourse.  After a bowel movement, women should cleanse from front to back. Use each tissue only  once. SEEK MEDICAL CARE IF:   You have back pain.  You develop a fever.  Your symptoms do not begin to resolve within 3 days. SEEK IMMEDIATE MEDICAL CARE IF:   You have severe back pain or lower abdominal pain.  You develop chills.  You have nausea or vomiting.  You have continued burning or discomfort with urination. MAKE SURE YOU:   Understand these instructions.  Will watch your condition.  Will get help right away if you are not doing well or get worse. Document Released: 11/26/2004 Document Revised: 08/18/2011 Document Reviewed: 03/27/2011 [facility] Patient Information 2014 [place].

## 2013-06-07 NOTE — ED Provider Notes (Signed)
Medical screening examination/treatment/procedure(s) were performed by non-physician practitioner and as supervising physician I was immediately available for consultation/collaboration.   EKG Interpretation None        Ephraim Hamburger, MD 06/07/13 [phone]

## 2015-02-21 IMAGING — US US ABDOMEN COMPLETE
1 series · 13 of 25 positions shown · non-contrast
Comparison: US ABDOMEN COMPLETE dated 09/04/2010

CLINICAL DATA: Right upper quadrant pain.

EXAM:
ULTRASOUND ABDOMEN COMPLETE

[Series 1: us abdomen complete · 0.27mm/px · 13 of 82 slices shown]
[im 1/82]
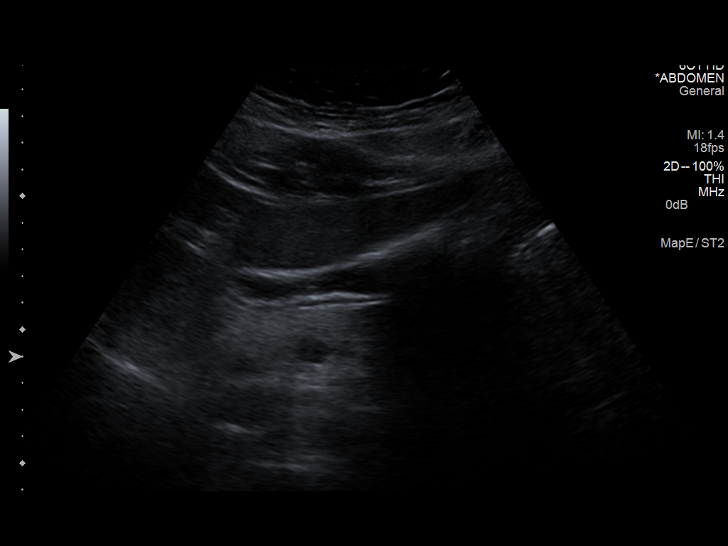
[im 7/82]
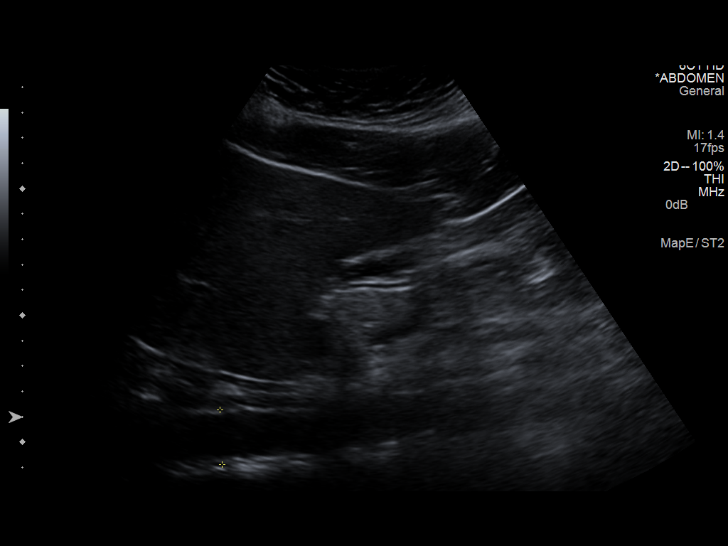
[im 14/82]
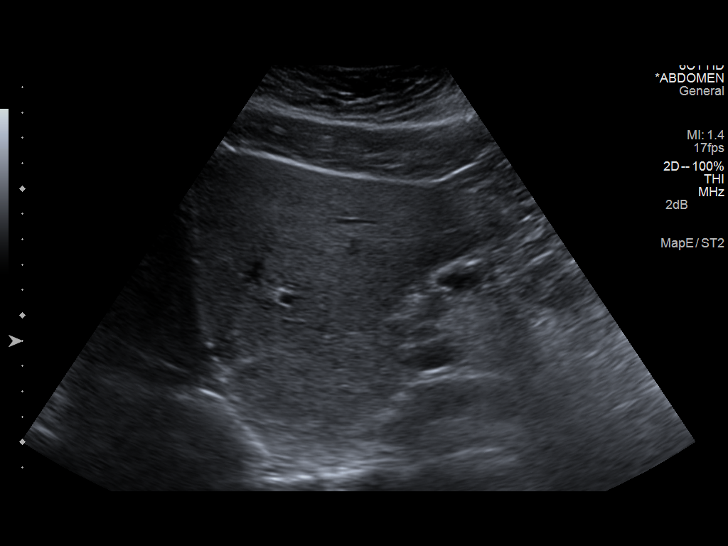
[im 21/82]
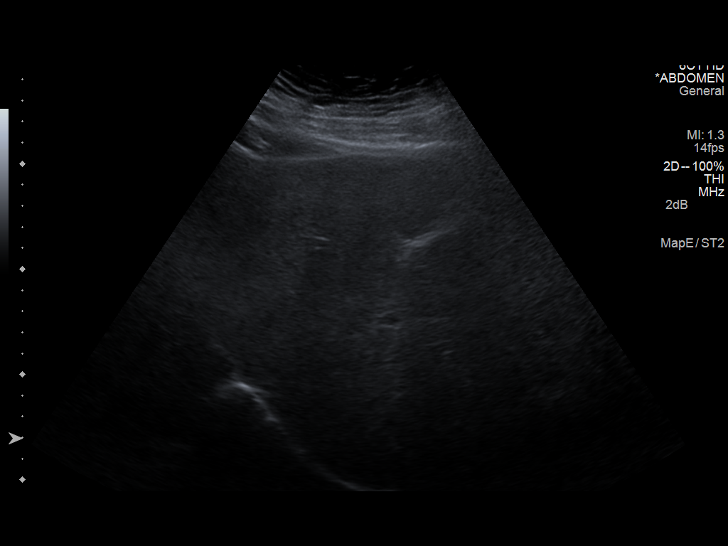
[im 28/82]
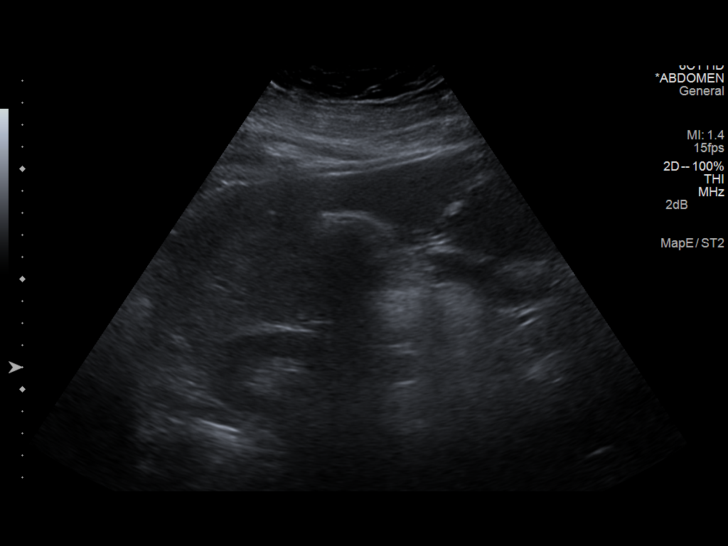
[im 34/82]
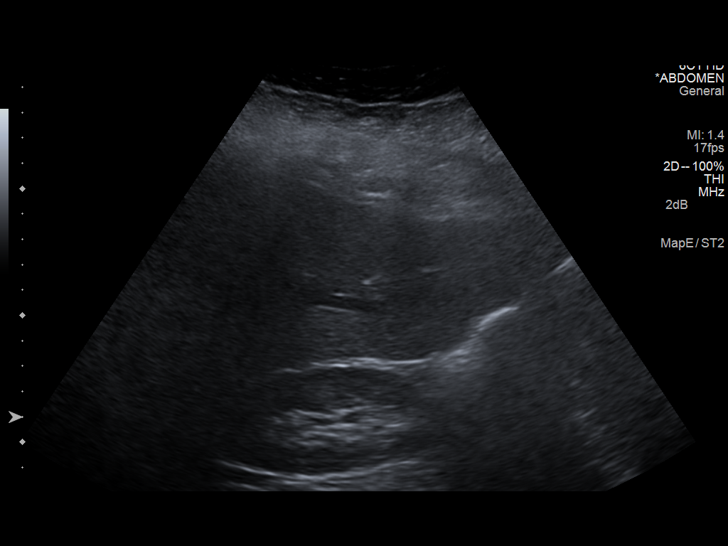
[im 41/82]
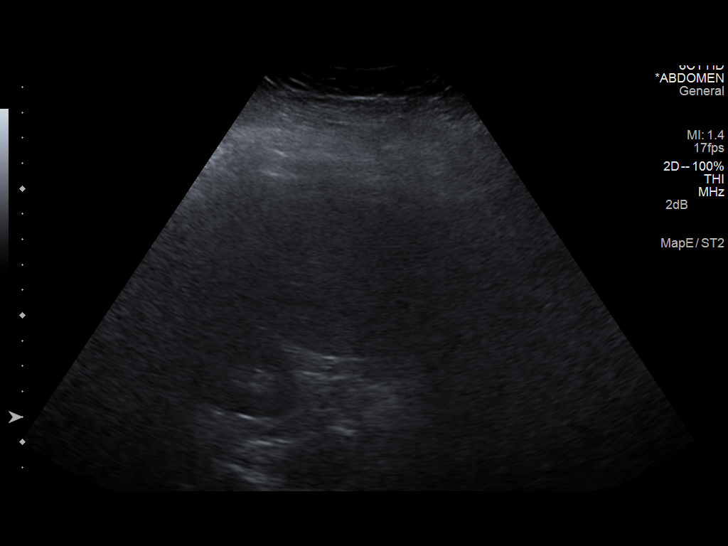
[im 48/82]
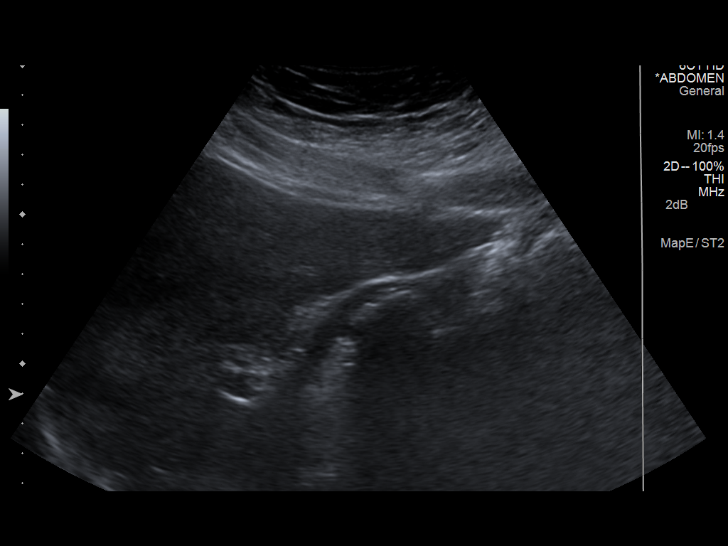
[im 55/82]
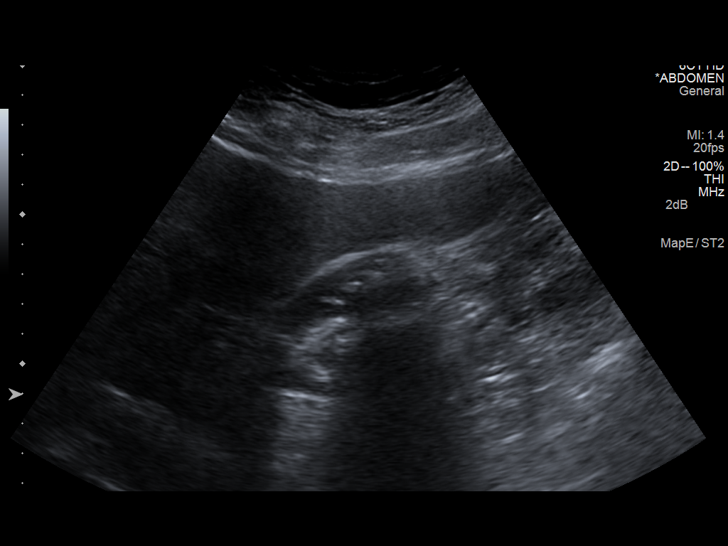
[im 61/82]
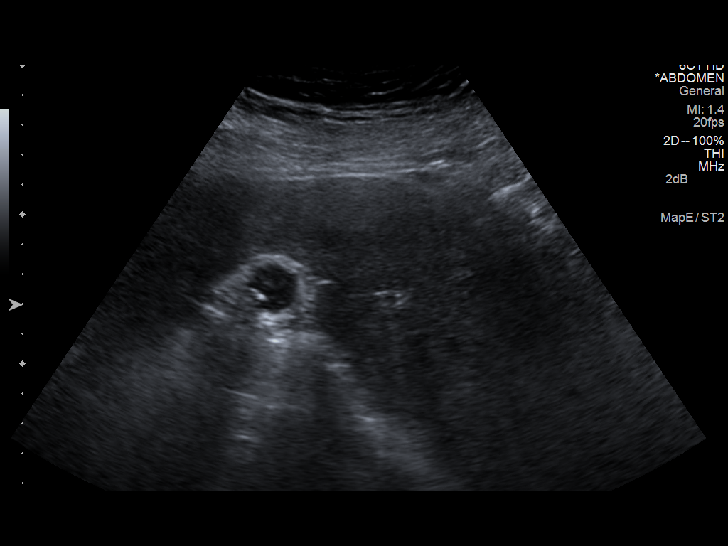
[im 68/82]
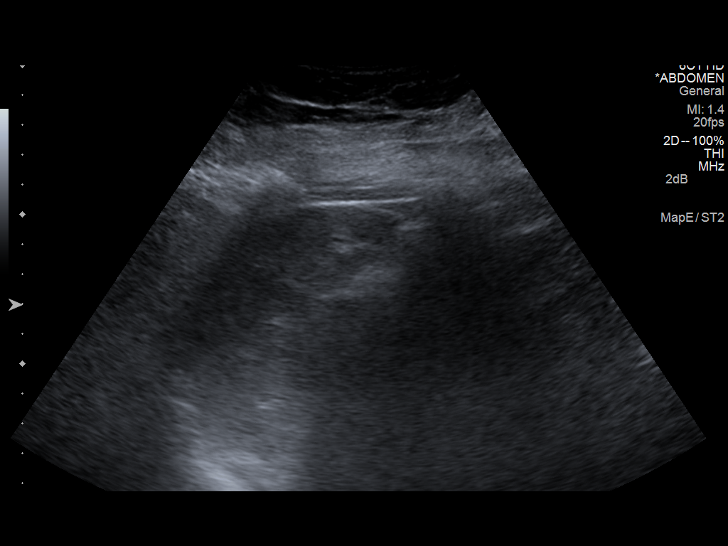
[im 75/82]
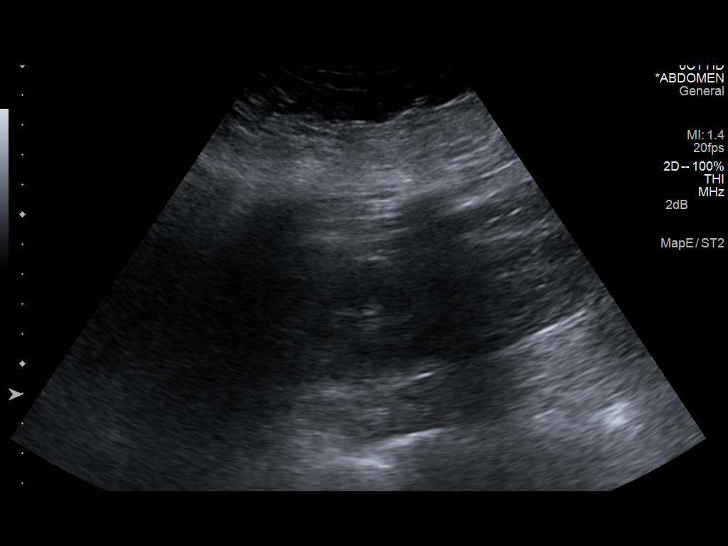
[im 82/82]
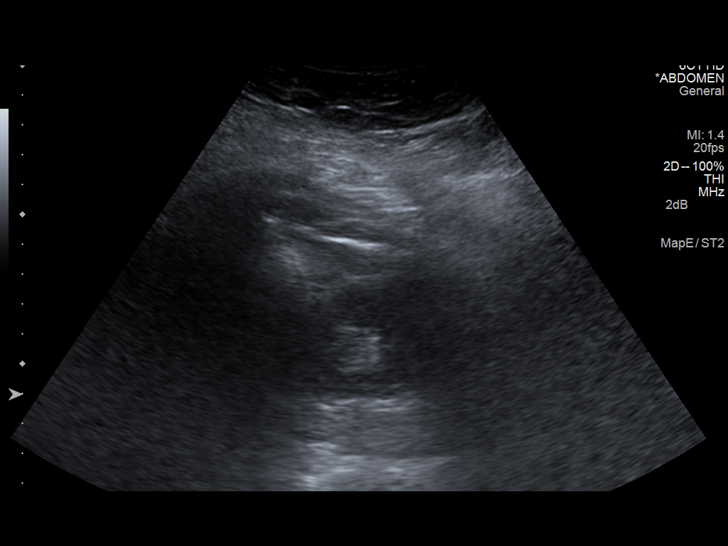

[13 of 25 positions shown; findings below may reference images not displayed]

FINDINGS: Gallbladder:

Numerous gallstones fill the gallbladder. Gallbladder wall is upper
limits of normal at 2.5 mm. Negative sonographic Murphy's sign. No
pericholecystic fluid.

Common bile duct:

Diameter: Dilated, measuring 10 mm, slightly increased from 8 mm
previously. The distal common bile duct cannot be visualized due to
overlying bowel gas.

Liver:

Previously seen echogenic focus within the right hepatic dome is not
visualized on today's study. Mildly increased echotexture may
reflect mild fatty infiltration. No visible intrahepatic biliary
ductal dilatation.

IVC:

No abnormality visualized.

Pancreas:

Visualized portion unremarkable.

Spleen:

Size and appearance within normal limits.

Right Kidney:

Length: 10.4 cm. Echogenicity within normal limits. No mass or
hydronephrosis visualized.

Left Kidney:

Length: 11.4 cm. Echogenicity within normal limits. No mass or
hydronephrosis visualized.

Abdominal aorta:

No aneurysm visualized.

Other findings:

None.
IMPRESSION: Numerous gallstones within the gallbladder. No definite sonographic
evidence of acute cholecystitis. However, the common bile duct is
dilated, measuring 10 mm. The distal common bile duct cannot be
visualized to exclude distal CBD stones. Recommend correlation with
LFTs and MRCP if further evaluation is felt warranted.

## 2016-01-24 NOTE — ED Notes (Signed)
Pt called regarding surgeon information, pt never followed up with surgeon and lost paperwork with contact info for the surgeon.  Pt given phone number.

## 2016-03-11 ENCOUNTER — Ambulatory Visit: Payer: Self-pay | Admitting: Surgery

## 2018-04-23 ENCOUNTER — Encounter (HOSPITAL_COMMUNITY): Payer: Self-pay | Admitting: Emergency Medicine

## 2018-04-23 ENCOUNTER — Other Ambulatory Visit: Payer: Self-pay

## 2018-04-23 ENCOUNTER — Emergency Department (HOSPITAL_COMMUNITY)
Admission: EM | Admit: 2018-04-23 | Discharge: 2018-04-23 | Disposition: A | Discharge status: Home / Self Care | Payer: Self-pay | Attending: Emergency Medicine | Admitting: Emergency Medicine

## 2018-04-23 DIAGNOSIS — E871 Hypo-osmolality and hyponatremia: Secondary | ICD-10-CM | POA: Insufficient documentation

## 2018-04-23 DIAGNOSIS — Z91040 Latex allergy status: Secondary | ICD-10-CM | POA: Insufficient documentation

## 2018-04-23 DIAGNOSIS — D75839 Thrombocytosis, unspecified: Secondary | ICD-10-CM

## 2018-04-23 DIAGNOSIS — D509 Iron deficiency anemia, unspecified: Secondary | ICD-10-CM | POA: Insufficient documentation

## 2018-04-23 DIAGNOSIS — R7401 Elevation of levels of liver transaminase levels: Secondary | ICD-10-CM

## 2018-04-23 DIAGNOSIS — K8050 Calculus of bile duct without cholangitis or cholecystitis without obstruction: Secondary | ICD-10-CM | POA: Insufficient documentation

## 2018-04-23 DIAGNOSIS — D473 Essential (hemorrhagic) thrombocythemia: Secondary | ICD-10-CM | POA: Insufficient documentation

## 2018-04-23 DIAGNOSIS — R740 Nonspecific elevation of levels of transaminase and lactic acid dehydrogenase [LDH]: Secondary | ICD-10-CM | POA: Insufficient documentation

## 2018-04-23 LAB — CBC WITH DIFFERENTIAL/PLATELET
ABS IMMATURE GRANULOCYTES: 0.03 10*3/uL (ref 0.00–0.07)
BASOS ABS: 0.1 10*3/uL (ref 0.0–0.1)
Basophils Relative: 1 %
EOS ABS: 0.6 10*3/uL — AB (ref 0.0–0.5)
Eosinophils Relative: 6 %
HEMATOCRIT: 33.7 % — AB (ref 36.0–46.0)
Hemoglobin: 9.6 g/dL — ABNORMAL LOW (ref 12.0–15.0)
IMMATURE GRANULOCYTES: 0 %
LYMPHS ABS: 2.4 10*3/uL (ref 0.7–4.0)
LYMPHS PCT: 26 %
MCH: 20.4 pg — ABNORMAL LOW (ref 26.0–34.0)
MCHC: 28.5 g/dL — ABNORMAL LOW (ref 30.0–36.0)
MCV: 71.5 fL — ABNORMAL LOW (ref 80.0–100.0)
Monocytes Absolute: 0.7 10*3/uL (ref 0.1–1.0)
Monocytes Relative: 8 %
NEUTROS ABS: 5.4 10*3/uL (ref 1.7–7.7)
NEUTROS PCT: 59 %
NRBC: 0 % (ref 0.0–0.2)
Platelets: 458 10*3/uL — ABNORMAL HIGH (ref 150–400)
RBC: 4.71 MIL/uL (ref 3.87–5.11)
RDW: 21.3 % — ABNORMAL HIGH (ref 11.5–15.5)
WBC: 9.1 10*3/uL (ref 4.0–10.5)

## 2018-04-23 LAB — COMPREHENSIVE METABOLIC PANEL
ALBUMIN: 3.8 g/dL (ref 3.5–5.0)
ALK PHOS: 124 U/L (ref 38–126)
ALT: 57 U/L — ABNORMAL HIGH (ref 0–44)
ANION GAP: 9 (ref 5–15)
AST: 23 U/L (ref 15–41)
BILIRUBIN TOTAL: 0 mg/dL — AB (ref 0.3–1.2)
BUN: 14 mg/dL (ref 6–20)
CALCIUM: 9 mg/dL (ref 8.9–10.3)
CO2: 23 mmol/L (ref 22–32)
Chloride: 100 mmol/L (ref 98–111)
Creatinine, Ser: 1.02 mg/dL — ABNORMAL HIGH (ref 0.44–1.00)
GFR calc Af Amer: >60 mL/min (ref >60)
GLUCOSE: 115 mg/dL — AB (ref 70–99)
POTASSIUM: 3.8 mmol/L (ref 3.5–5.1)
Sodium: 132 mmol/L — ABNORMAL LOW (ref 135–145)
Total Protein: 8 g/dL (ref 6.5–8.1)

## 2018-04-23 LAB — I-STAT BETA HCG BLOOD, ED (MC, WL, AP ONLY): I-stat hCG, quantitative: <5 m[IU]/mL (ref <5)

## 2018-04-23 LAB — LIPASE, BLOOD: Lipase: 28 U/L (ref 11–51)

## 2018-04-23 MED ORDER — SODIUM CHLORIDE 0.9 % IV BOLUS
1000.0000 mL | Freq: Once | INTRAVENOUS | Status: AC
Start: 2018-04-23 — End: 2018-04-23
  Administered 2018-04-23: 1000 mL via INTRAVENOUS

## 2018-04-23 MED ORDER — MORPHINE SULFATE (PF) 4 MG/ML IV SOLN
4.0000 mg | Freq: Once | INTRAVENOUS | Status: AC
  Administered 2018-04-23: 4 mg via INTRAVENOUS
  Filled 2018-04-23: qty 1

## 2018-04-23 MED ORDER — ONDANSETRON 4 MG PO TBDP: 4.0000 mg | ORAL_TABLET | Freq: Three times a day (TID) | ORAL | 0 refills | Status: DC | PRN

## 2018-04-23 MED ORDER — HYDROMORPHONE HCL 1 MG/ML IJ SOLN
1.0000 mg | Freq: Once | INTRAMUSCULAR | Status: AC
  Administered 2018-04-23: 1 mg via INTRAVENOUS
  Filled 2018-04-23: qty 1

## 2018-04-23 MED ORDER — ONDANSETRON HCL 4 MG/2ML IJ SOLN
4.0000 mg | Freq: Once | INTRAMUSCULAR | Status: AC
  Administered 2018-04-23: 4 mg via INTRAVENOUS
  Filled 2018-04-23: qty 2

## 2018-04-23 MED ORDER — OXYCODONE-ACETAMINOPHEN 5-325 MG PO TABS: ORAL_TABLET | ORAL | 0 refills | Status: DC

## 2018-04-23 NOTE — Discharge Instructions (Signed)
Eat a bland diet, avoiding greasy, fatty, fried foods, as well as spicy and acidic foods or beverages.  Avoid eating within 2 to 3 hours before going to bed or laying down.  Also avoid teas, colas, coffee, chocolate, pepermint and spearment.  Take the prescriptions as directed.  Call your regular medical doctor and the General Surgeon on Monday to schedule a follow up appointment this week.  Return to the Emergency Department immediately if worsening.

## 2018-04-23 NOTE — Consult Note (Signed)
Reason for Consult: Biliary colic, cholelithiasis Referring Physician: Dr. Glick  Cheryl [name] is an 28 y.o. female.  HPI: Patient is a 28 year old black female with a known history of cholelithiasis who presented with a 24-hour history of worsening right upper quadrant abdominal pain and nausea.  She has had multiple episodes of biliary colic in the past.  She has been referred to surgery in the past but did not go due to her financial situation.  She has had about 3 episodes of biliary colic since last year.  She denies any fever, chills, or jaundice.  She has ultrasound documented cholelithiasis.  Her liver tests are only mildly elevated.  Her white blood cell count is within normal limits.  She has received pain medication and does feel better.  She has 2 out of 10 abdominal pain.  Past Medical History:  Diagnosis Date  . Gall stones     History reviewed. No pertinent surgical history.  History reviewed. No pertinent family history.  Social History:  reports that she has never smoked. She has never used smokeless tobacco. She reports that she does not drink alcohol or use drugs.  Allergies:  Allergies  Allergen Reactions  . Latex Itching, Rash and Other (See Comments)    Powder inside the glove    Medications: I have reviewed the patient's current medications.  Results for orders placed or performed during the hospital encounter of 04/23/18 (from the past 48 hour(s))  Comprehensive metabolic panel     Status: Abnormal   Collection Time: 04/23/18  1:33 AM  Result Value Ref Range   Sodium 132 (L) 135 - 145 mmol/L   Potassium 3.8 3.5 - 5.1 mmol/L   Chloride 100 98 - 111 mmol/L   CO2 23 22 - 32 mmol/L   Glucose, Bld 115 (H) 70 - 99 mg/dL   BUN 14 6 - 20 mg/dL   Creatinine, Ser 1.02 (H) 0.44 - 1.00 mg/dL   Calcium 9.0 8.9 - 10.3 mg/dL   Total Protein 8.0 6.5 - 8.1 g/dL   Albumin 3.8 3.5 - 5.0 g/dL   AST 23 15 - 41 U/L   ALT 57 (H) 0 - 44 U/L   Alkaline Phosphatase 124 38 -  126 U/L   Total Bilirubin 0.0 (L) 0.3 - 1.2 mg/dL   GFR calc non Af Amer >60 >60 mL/min   GFR calc Af Amer >60 >60 mL/min   Anion gap 9 5 - 15    Comment: Performed at [facility], [address] [place], [place] [address]  Lipase, blood     Status: None   Collection Time: 04/23/18  1:33 AM  Result Value Ref Range   Lipase 28 11 - 51 U/L    Comment: Performed at [facility], [address], [place], [place] [address]  CBC with Differential     Status: Abnormal   Collection Time: 04/23/18  1:33 AM  Result Value Ref Range   WBC 9.1 4.0 - 10.5 K/uL   RBC 4.71 3.87 - 5.11 MIL/uL   Hemoglobin 9.6 (L) 12.0 - 15.0 g/dL   HCT 33.7 (L) 36.0 - 46.0 %   MCV 71.5 (L) 80.0 - 100.0 fL   MCH 20.4 (L) 26.0 - 34.0 pg   MCHC 28.5 (L) 30.0 - 36.0 g/dL   RDW 21.3 (H) 11.5 - 15.5 %   Platelets 458 (H) 150 - 400 K/uL   nRBC 0.0 0.0 - 0.2 %   Neutrophils Relative % 59 %  Neutro Abs 5.4 1.7 - 7.7 K/uL   Lymphocytes Relative 26 %   Lymphs Abs 2.4 0.7 - 4.0 K/uL   Monocytes Relative 8 %   Monocytes Absolute 0.7 0.1 - 1.0 K/uL   Eosinophils Relative 6 %   Eosinophils Absolute 0.6 (H) 0.0 - 0.5 K/uL   Basophils Relative 1 %   Basophils Absolute 0.1 0.0 - 0.1 K/uL   Immature Granulocytes 0 %   Abs Immature Granulocytes 0.03 0.00 - 0.07 K/uL    Comment: Performed at [facility], [address], [place], [place] [address]  I-Stat beta hCG blood, ED     Status: None   Collection Time: 04/23/18  1:56 AM  Result Value Ref Range   I-stat hCG, quantitative <5.0 <5 mIU/mL   Comment 3            Comment:   GEST. AGE      CONC.  (mIU/mL)   <=1 WEEK        5 - 50     2 WEEKS       50 - 500     3 WEEKS       100 - 10,000     4 WEEKS     1,000 - 30,000        FEMALE AND NON-PREGNANT FEMALE:     LESS THAN 5 mIU/mL     No results found.  ROS:  Pertinent items are noted in HPI.  Blood pressure (!) 140/107, pulse 65, temperature 98.2 F (36.8 C), temperature source Oral, resp. rate 18, height 5\' 3"  (1.6  m), weight (!) 158.8 kg, last menstrual period 04/11/2018, SpO2 100 %. Physical Exam: Morbidly obese black female no acute distress Head is normocephalic, atraumatic Eyes without scleral icterus Lungs clear to auscultation with equal breath sounds bilaterally Heart examination reveals regular rate and rhythm without S3, S4, murmurs Abdomen is soft without tenderness noted in the right upper quadrant.  No rigidity is noted.  Could not assess hepatosplenomegaly due to body habitus.  ER notes reviewed  Assessment/Plan: Impression: Biliary colic secondary to cholelithiasis. Plan: We will give her information concerning finding financial assistance.  I do recommend that she gets her gallbladder out in the future as her biliary colic seems to be increasing in frequency.  She understands this and agrees.  She is okay for discharge. Aviva Signs 04/23/2018, 8:43 AM

## 2018-04-23 NOTE — ED Provider Notes (Signed)
[facility] EMERGENCY DEPARTMENT Provider Note   CSN: [id number] Arrival date & time: 04/23/18  0133    History   Chief Complaint Chief Complaint  Patient presents with  . Abdominal Pain    HPI Cheryl [name] is a 28 y.o. female.  The history is provided by the patient.  She has history of gallstones and comes in complaining of right upper quadrant pain which started about 5:30PM.  Pain radiates to the back but not to the chest or shoulder.  She rates pain at 10/10.  There is associated nausea but no vomiting.  She denies fever, chills, sweats.  This is similar to pain she has had with flareups of her gallstones in the past.  She had been referred to surgery in the past, but did not follow-up because of financial reasons.  Past Medical History:  Diagnosis Date  . Gall stones     Patient Active Problem List   Diagnosis Date Noted  . OBESITY 01/02/2008  . IRREGULAR MENSTRUAL CYCLE 01/02/2008    History reviewed. No pertinent surgical history.   OB History   No obstetric history on file.      Home Medications    Prior to Admission medications   Medication Sig Start Date End Date Taking? Authorizing Provider  acetaminophen-codeine (TYLENOL #3) 300-30 MG per tablet Take 1-2 tablets by mouth every 6 (six) hours as needed for severe pain. 06/06/13   Piepenbrink, Anderson Malta, PA-C  ciprofloxacin (CIPRO) 500 MG tablet Take 1 tablet (500 mg total) by mouth 2 (two) times daily. 06/06/13   Piepenbrink, Anderson Malta, PA-C    Family History History reviewed. No pertinent family history.  Social History Social History   Tobacco Use  . Smoking status: Never Smoker  . Smokeless tobacco: Never Used  Substance Use Topics  . Alcohol use: No  . Drug use: No     Allergies   Latex   Review of Systems Review of Systems  All other systems reviewed and are negative.    Physical Exam Updated Vital Signs BP 129/79 (BP Location: Left Arm)   Pulse 71   Temp 98.2 F (36.8 C)  (Oral)   Resp 20   Ht 5\' 3"  (1.6 m)   Wt (!) 158.8 kg   LMP 04/11/2018 (Approximate)   SpO2 99%   BMI 62.00 kg/m   Physical Exam Vitals signs and nursing note reviewed.    Morbidly obese 28 year old female, resting comfortably and in no acute distress. Vital signs are normal. Oxygen saturation is 99%, which is normal. Head is normocephalic and atraumatic. PERRLA, EOMI. Oropharynx is clear. Neck is nontender and supple without adenopathy or JVD. Back is nontender and there is no CVA tenderness. Lungs are clear without rales, wheezes, or rhonchi. Chest is nontender. Heart has regular rate and rhythm without murmur. Abdomen is soft, flat, with moderate right upper quadrant tenderness.  There is no rebound or guarding.  There is a plus/minus Murphy sign present.  There are no masses or hepatosplenomegaly and peristalsis is hypoactive. Extremities have no cyanosis or edema, full range of motion is present. Skin is warm and dry without rash. Neurologic: Mental status is normal, cranial nerves are intact, there are no motor or sensory deficits.  ED Treatments / Results  Labs (all labs ordered are listed, but only abnormal results are displayed) Labs Reviewed  COMPREHENSIVE METABOLIC PANEL - Abnormal; Notable for the following components:      Result Value   Sodium 132 (*)  Glucose, Bld 115 (*)    Creatinine, Ser 1.02 (*)    ALT 57 (*)    Total Bilirubin 0.0 (*)    All other components within normal limits  CBC WITH DIFFERENTIAL/PLATELET - Abnormal; Notable for the following components:   Hemoglobin 9.6 (*)    HCT 33.7 (*)    MCV 71.5 (*)    MCH 20.4 (*)    MCHC 28.5 (*)    RDW 21.3 (*)    Platelets 458 (*)    Eosinophils Absolute 0.6 (*)    All other components within normal limits  LIPASE, BLOOD  I-STAT BETA HCG BLOOD, ED ([facility], [facility], [facility] ONLY)   Procedures Procedures   Medications Ordered in ED Medications  morphine 4 MG/ML injection 4 mg (has no administration in time  range)  ondansetron (ZOFRAN) injection 4 mg (has no administration in time range)  sodium chloride 0.9 % bolus 1,000 mL (has no administration in time range)     Initial Impression / Assessment and Plan / ED Course  I have reviewed the triage vital signs and the nursing notes.  Pertinent lab results that were available during my care of the patient were reviewed by me and considered in my medical decision making (see chart for details).  Abdominal pain consistent with biliary colic and patient with known gallstones.  Old records are reviewed, and she has 2 prior ED visits for cholelithiasis and biliary colic.  With known gallstones present, no indication for ED ultrasound.  Will check screening labs and give IV fluids, morphine, ondansetron.  Labs are significant for microcytic anemia which is unchanged from baseline, mild thrombocytosis, minimal elevation of AST which is not felt to be clinically significant, and mild hyponatremia.  She did not get pain relief with morphine and was given additional morphine which also failed to give her significant relief.  She was then given a dose of hydromorphone.  Following this, she seemed to be resting comfortably, but when asked, stated that her pain was still there.  I did try to explain to the patient that the goal was not complete relief of pain but management of pain.  We will ask for for general surgery consultation.  Case is discussed with Dr. Arnoldo Morale who agrees to see the patient in the ED.  Final Clinical Impressions(s) / ED Diagnoses   Final diagnoses:  Biliary colic  Elevated AST (SGOT)  Microcytic anemia  Thrombocytosis (HCC)  Hyponatremia    ED Discharge Orders    None       Delora Fuel, MD 91/79/15 [phone]

## 2018-04-23 NOTE — ED Provider Notes (Signed)
Pt received at sign out with General Surgery consult pending. Please see previous EDP note for full details regarding HPI/H&P/MDM. General Surgery has evaluated pt in the ED (See consult note):  OK to d/c home, f/u office for elective surgery. Will d/c stable.    Francine Graven, DO 04/23/18 [phone]

## 2018-04-23 NOTE — ED Triage Notes (Signed)
Patient complaining of right lower quadrant pain that radiates into her back. Patient has a history of gallstones. Patient complaining of nausea and some diarrhea. Patient states pain became severe yesterday afternoon.

## 2018-04-24 ENCOUNTER — Emergency Department (HOSPITAL_COMMUNITY): Payer: Self-pay

## 2018-04-24 ENCOUNTER — Other Ambulatory Visit: Payer: Self-pay

## 2018-04-24 ENCOUNTER — Emergency Department (HOSPITAL_COMMUNITY)
Admission: EM | Admit: 2018-04-24 | Discharge: 2018-04-24 | Disposition: A | Discharge status: Home / Self Care | Payer: Self-pay | Attending: Emergency Medicine | Admitting: Emergency Medicine

## 2018-04-24 ENCOUNTER — Encounter (HOSPITAL_COMMUNITY): Payer: Self-pay | Admitting: Emergency Medicine

## 2018-04-24 DIAGNOSIS — Z91040 Latex allergy status: Secondary | ICD-10-CM | POA: Insufficient documentation

## 2018-04-24 DIAGNOSIS — R102 Pelvic and perineal pain: Secondary | ICD-10-CM | POA: Insufficient documentation

## 2018-04-24 DIAGNOSIS — K8050 Calculus of bile duct without cholangitis or cholecystitis without obstruction: Secondary | ICD-10-CM | POA: Insufficient documentation

## 2018-04-24 DIAGNOSIS — Z79899 Other long term (current) drug therapy: Secondary | ICD-10-CM | POA: Insufficient documentation

## 2018-04-24 DIAGNOSIS — R52 Pain, unspecified: Secondary | ICD-10-CM

## 2018-04-24 DIAGNOSIS — K8020 Calculus of gallbladder without cholecystitis without obstruction: Secondary | ICD-10-CM | POA: Insufficient documentation

## 2018-04-24 LAB — COMPREHENSIVE METABOLIC PANEL
ALT: 41 U/L (ref 0–44)
AST: 18 U/L (ref 15–41)
Albumin: 3.8 g/dL (ref 3.5–5.0)
Alkaline Phosphatase: 108 U/L (ref 38–126)
Anion gap: 10 (ref 5–15)
BUN: 12 mg/dL (ref 6–20)
CALCIUM: 9 mg/dL (ref 8.9–10.3)
CO2: 22 mmol/L (ref 22–32)
CREATININE: 1.02 mg/dL — AB (ref 0.44–1.00)
Chloride: 105 mmol/L (ref 98–111)
GFR calc Af Amer: >60 mL/min (ref >60)
GFR calc non Af Amer: >60 mL/min (ref >60)
Glucose, Bld: 99 mg/dL (ref 70–99)
Potassium: 3.9 mmol/L (ref 3.5–5.1)
Sodium: 137 mmol/L (ref 135–145)
Total Bilirubin: 0.3 mg/dL (ref 0.3–1.2)
Total Protein: 8.1 g/dL (ref 6.5–8.1)

## 2018-04-24 LAB — CBC WITH DIFFERENTIAL/PLATELET
Abs Immature Granulocytes: 0.04 10*3/uL (ref 0.00–0.07)
Basophils Absolute: 0.1 10*3/uL (ref 0.0–0.1)
Basophils Relative: 1 %
EOS PCT: 3 %
Eosinophils Absolute: 0.3 10*3/uL (ref 0.0–0.5)
HCT: 36.3 % (ref 36.0–46.0)
Hemoglobin: 10.3 g/dL — ABNORMAL LOW (ref 12.0–15.0)
Immature Granulocytes: 0 %
LYMPHS PCT: 18 %
Lymphs Abs: 1.7 10*3/uL (ref 0.7–4.0)
MCH: 20.9 pg — ABNORMAL LOW (ref 26.0–34.0)
MCHC: 28.4 g/dL — ABNORMAL LOW (ref 30.0–36.0)
MCV: 73.5 fL — ABNORMAL LOW (ref 80.0–100.0)
Monocytes Absolute: 0.6 10*3/uL (ref 0.1–1.0)
Monocytes Relative: 6 %
Neutro Abs: 7 10*3/uL (ref 1.7–7.7)
Neutrophils Relative %: 72 %
Platelets: 358 10*3/uL (ref 150–400)
RBC: 4.94 MIL/uL (ref 3.87–5.11)
RDW: 20.9 % — ABNORMAL HIGH (ref 11.5–15.5)
WBC: 9.6 10*3/uL (ref 4.0–10.5)
nRBC: 0 % (ref 0.0–0.2)

## 2018-04-24 LAB — I-STAT BETA HCG BLOOD, ED (MC, WL, AP ONLY): I-stat hCG, quantitative: <5 m[IU]/mL (ref <5)

## 2018-04-24 LAB — LIPASE, BLOOD: Lipase: 25 U/L (ref 11–51)

## 2018-04-24 MED ORDER — OXYCODONE-ACETAMINOPHEN 5-325 MG PO TABS
1.0000 | ORAL_TABLET | Freq: Once | ORAL | Status: AC
  Administered 2018-04-24: 1 via ORAL
  Filled 2018-04-24: qty 1

## 2018-04-24 MED ORDER — ONDANSETRON HCL 4 MG/2ML IJ SOLN
4.0000 mg | Freq: Once | INTRAMUSCULAR | Status: AC
  Administered 2018-04-24: 4 mg via INTRAVENOUS
  Filled 2018-04-24: qty 2

## 2018-04-24 MED ORDER — HYDROMORPHONE HCL 1 MG/ML IJ SOLN
1.0000 mg | Freq: Once | INTRAMUSCULAR | Status: AC
  Administered 2018-04-24: 1 mg via INTRAVENOUS
  Filled 2018-04-24: qty 1

## 2018-04-24 NOTE — ED Notes (Signed)
I-stat hCG <5

## 2018-04-24 NOTE — Discharge Instructions (Signed)
Get the pain and nausea medicines you were prescribed yesterday filled and avoid fatty foods as discussed.  Return here for any fevers, uncontrolled vomiting or pain not improved with your medicines.

## 2018-04-24 NOTE — ED Notes (Signed)
Pt reports her pain NEVER decreases past a 9.

## 2018-04-24 NOTE — ED Notes (Signed)
Pt given meal tray.

## 2018-04-24 NOTE — ED Triage Notes (Signed)
Patient complains of lower back pain and right lower quadrant pain. Patient was seen for same last night and told she had gallstones.

## 2018-04-26 NOTE — ED Provider Notes (Signed)
[facility] EMERGENCY DEPARTMENT Provider Note   CSN: [id number] Arrival date & time: 04/24/18  3235    History   Chief Complaint Chief Complaint  Patient presents with  . Abdominal Pain    HPI Cheryl [name] is a 28 y.o. female  With known gallbladder disease/cholelithiasis without cholecystitis based on her work up here yesterday, presenting with persistent RUQ abdominal pain along with n/v but no fevers or chills.  She had a surgical consult while here yesterday and plan was for outpatient followup to arrange an elective cholecystitis.  Pt reports she cannot tolerate the level of pain, but also endorses has not picked up her pain or nausea medication after being discharged home yesterday.  She has been able to tolerate fluids but has a reduced appetite since yesterdays visit. She has found no alleviators for her symptoms today.     The history is provided by the patient.    Past Medical History:  Diagnosis Date  . Gall stones     Patient Active Problem List   Diagnosis Date Noted  . Biliary colic   . OBESITY 01/02/2008  . IRREGULAR MENSTRUAL CYCLE 01/02/2008    History reviewed. No pertinent surgical history.   OB History   No obstetric history on file.      Home Medications    Prior to Admission medications   Medication Sig Start Date End Date Taking? Authorizing Provider  levonorgestrel-ethinyl estradiol (AVIANE,ALESSE,LESSINA) 0.1-20 MG-MCG tablet Take 1 tablet by mouth at bedtime.    [provider]  ondansetron (ZOFRAN ODT) 4 MG disintegrating tablet Take 1 tablet (4 mg total) by mouth every 8 (eight) hours as needed for nausea or vomiting. 04/23/18   Francine Graven, DO  oxyCODONE-acetaminophen (PERCOCET/ROXICET) 5-325 MG tablet 1 or 2 tabs PO q8h prn pain 04/23/18   Francine Graven, DO    Family History No family history on file.  Social History Social History   Tobacco Use  . Smoking status: Never Smoker  . Smokeless tobacco: Never  Used  Substance Use Topics  . Alcohol use: No  . Drug use: No     Allergies   Latex   Review of Systems Review of Systems  Constitutional: Negative for chills and fever.  HENT: Negative for congestion and sore throat.   Eyes: Negative.   Respiratory: Negative for chest tightness and shortness of breath.   Cardiovascular: Negative for chest pain.  Gastrointestinal: Positive for abdominal pain, nausea and vomiting. Negative for diarrhea.  Genitourinary: Negative.   Musculoskeletal: Negative for arthralgias, joint swelling and neck pain.  Skin: Negative.  Negative for color change, rash and wound.  Neurological: Negative for dizziness, weakness, light-headedness, numbness and headaches.  Psychiatric/Behavioral: Negative.      Physical Exam Updated Vital Signs BP 124/84   Pulse 82   Temp 98.1 F (36.7 C) (Oral)   Resp 16   Ht 5\' 3"  (1.6 m)   Wt (!) 158.7 kg   LMP 04/11/2018 (Approximate)   SpO2 98%   BMI 62.00 kg/m   Physical Exam Vitals signs and nursing note reviewed.  Constitutional:      Appearance: She is well-developed.  HENT:     Head: Normocephalic and atraumatic.  Eyes:     General: No scleral icterus.    Conjunctiva/sclera: Conjunctivae normal.  Neck:     Musculoskeletal: Normal range of motion.  Cardiovascular:     Rate and Rhythm: Normal rate and regular rhythm.     Heart sounds: Normal heart  sounds.  Pulmonary:     Effort: Pulmonary effort is normal.     Breath sounds: Normal breath sounds. No wheezing.  Abdominal:     General: Bowel sounds are normal.     Palpations: Abdomen is soft.     Tenderness: There is abdominal tenderness in the right upper quadrant. There is no guarding or rebound. Positive signs include Murphy's sign.  Musculoskeletal: Normal range of motion.  Skin:    General: Skin is warm and dry.  Neurological:     Mental Status: She is alert.      ED Treatments / Results  Labs (all labs ordered are listed, but only  abnormal results are displayed) Labs Reviewed  COMPREHENSIVE METABOLIC PANEL - Abnormal; Notable for the following components:      Result Value   Creatinine, Ser 1.02 (*)    All other components within normal limits  CBC WITH DIFFERENTIAL/PLATELET - Abnormal; Notable for the following components:   Hemoglobin 10.3 (*)    MCV 73.5 (*)    MCH 20.9 (*)    MCHC 28.4 (*)    RDW 20.9 (*)    All other components within normal limits  LIPASE, BLOOD  I-STAT BETA HCG BLOOD, ED ([facility], [facility], [facility] ONLY)    EKG None  Radiology  US Abdomen Limited Ruq  Result Date: 04/24/2018 CLINICAL DATA:  Right upper quadrant pain. EXAM: ULTRASOUND ABDOMEN LIMITED RIGHT UPPER QUADRANT COMPARISON:  06/06/2013 FINDINGS: Gallbladder: A "wall echo shadow complex" is identified, most likely representing a stone filled contracted gallbladder. No pericholecystic fluid. Upper normal wall thickness, 4 mm. Sonographic Murphy's sign was not elicited. Common bile duct: Diameter: Upper normal for age, 6 mm. Liver: No focal lesion identified. Within normal limits in parenchymal echogenicity. Portal vein is patent on color Doppler imaging with normal direction of blood flow towards the liver. Mild degradation secondary to patient's inability to hold still and body habitus. IMPRESSION: "Wall echo shadow complex", most typically seen with a stone filled, contracted gallbladder. No specific evidence of acute inflammation. Mild degradation, as above. Electronically Signed   By: Abigail Miyamoto M.D.   On: 04/24/2018 11:40     Procedures Procedures (including critical care time)  Medications Ordered in ED Medications  HYDROmorphone (DILAUDID) injection 1 mg (1 mg Intravenous Given 04/24/18 1110)  ondansetron (ZOFRAN) injection 4 mg (4 mg Intravenous Given 04/24/18 1110)  oxyCODONE-acetaminophen (PERCOCET/ROXICET) 5-325 MG per tablet 1 tablet (1 tablet Oral Given 04/24/18 1223)     Initial Impression / Assessment and Plan / ED Course  I  have reviewed the triage vital signs and the nursing notes.  Pertinent labs & imaging results that were available during my care of the patient were reviewed by me and considered in my medical decision making (see chart for details).        Pt with long standing h/o gallstones/episodic biliary colic with no evidence of acute cholecystitis today.  Her labs are improved in comparison to yesterdays including a normalized Na+ and ALT levels.  Normal remaining lipase, chronic anemia.  [place] today confirms gallstones, no evidence of acute cholecystitis , doubt CBD stone given normal labs.  Pt was referred to Dr. Arnoldo Morale, advised to call today for an ov for anticipation of surgery. Also encouraged to get her pain and nausea meds started.  Her sx were improved after giving IV fluids and pain/nausea meds here. Outlined strict return precautions.  Discussed avoiding fatty foods.   Final Clinical Impressions(s) / ED Diagnoses  Final diagnoses:  Pain  Gallstones  Biliary colic    ED Discharge Orders    None       Landis Martins 04/26/18 [place], [place], DO 04/27/18 1508

## 2018-05-05 ENCOUNTER — Ambulatory Visit (INDEPENDENT_AMBULATORY_CARE_PROVIDER_SITE_OTHER): Payer: Self-pay | Admitting: General Surgery

## 2018-05-05 ENCOUNTER — Encounter: Payer: Self-pay | Admitting: General Surgery

## 2018-05-05 VITALS — BP 98/64 | HR 68 | Temp 96.8°F | Resp 22 | Wt 344.4 lb

## 2018-05-05 DIAGNOSIS — K8050 Calculus of bile duct without cholangitis or cholecystitis without obstruction: Secondary | ICD-10-CM

## 2018-05-05 NOTE — Patient Instructions (Signed)
Laparoscopic Cholecystectomy Laparoscopic cholecystectomy is surgery to remove the gallbladder. The gallbladder is a pear-shaped organ that lies beneath the liver on the right side of the body. The gallbladder stores bile, which is a fluid that helps the body to digest fats. Cholecystectomy is often done for inflammation of the gallbladder (cholecystitis). This condition is usually caused by a buildup of gallstones (cholelithiasis) in the gallbladder. Gallstones can block the flow of bile, which can result in inflammation and pain. In severe cases, emergency surgery may be required. This procedure is done though small incisions in your abdomen (laparoscopic surgery). A thin scope with a camera (laparoscope) is inserted through one incision. Thin surgical instruments are inserted through the other incisions. In some cases, a laparoscopic procedure may be turned into a type of surgery that is done through a larger incision (open surgery). Tell a health care provider about:  Any allergies you have.  All medicines you are taking, including vitamins, herbs, eye drops, creams, and over-the-counter medicines.  Any problems you or family members have had with anesthetic medicines.  Any blood disorders you have.  Any surgeries you have had.  Any medical conditions you have.  Whether you are pregnant or may be pregnant. What are the risks? Generally, this is a safe procedure. However, problems may occur, including:  Infection.  Bleeding.  Allergic reactions to medicines.  Damage to other structures or organs.  A stone remaining in the common bile duct. The common bile duct carries bile from the gallbladder into the small intestine.  A bile leak from the cyst duct that is clipped when your gallbladder is removed. What happens before the procedure?   Medicines  Ask your health care provider about: ? Changing or stopping your regular medicines. This is especially important if you are taking  diabetes medicines or blood thinners. ? Taking medicines such as aspirin and ibuprofen. These medicines can thin your blood. Do not take these medicines before your procedure if your health care provider instructs you not to.  You may be given antibiotic medicine to help prevent infection. General instructions  Let your health care provider know if you develop a cold or an infection before surgery.  Plan to have someone take you home from the hospital or clinic.  Ask your health care provider how your surgical site will be marked or identified. What happens during the procedure?   To reduce your risk of infection: ? Your health care team will wash or sanitize their hands. ? Your skin will be washed with soap. ? Hair may be removed from the surgical area.  An IV tube may be inserted into one of your veins.  You will be given one or more of the following: ? A medicine to help you relax (sedative). ? A medicine to make you fall asleep (general anesthetic).  A breathing tube will be placed in your mouth.  Your surgeon will make several small cuts (incisions) in your abdomen.  The laparoscope will be inserted through one of the small incisions. The camera on the laparoscope will send images to a TV screen (monitor) in the operating room. This lets your surgeon see inside your abdomen.  Air-like gas will be pumped into your abdomen. This will expand your abdomen to give the surgeon more room to perform the surgery.  Other tools that are needed for the procedure will be inserted through the other incisions. The gallbladder will be removed through one of the incisions.  Your common bile duct   may be examined. If stones are found in the common bile duct, they may be removed.  After your gallbladder has been removed, the incisions will be closed with stitches (sutures), staples, or skin glue.  Your incisions may be covered with a bandage (dressing). The procedure may vary among health  care providers and hospitals. What happens after the procedure?  Your blood pressure, heart rate, breathing rate, and blood oxygen level will be monitored until the medicines you were given have worn off.  You will be given medicines as needed to control your pain.  Do not drive for 24 hours if you were given a sedative. This information is not intended to replace advice given to you by your health care provider. Make sure you discuss any questions you have with your health care provider. Document Released: 02/16/2005 Document Revised: 01/14/2017 Document Reviewed: 08/05/2015 Elsevier Interactive Patient Education  2019 Elsevier Inc.  

## 2018-05-05 NOTE — Progress Notes (Signed)
Subjective:     Cheryl [name]  Here for ER follow-up visit.  Patient has a known history of cholelithiasis.  She states she has not had any significant right upper quadrant abdominal pain since I saw her in the emergency room.  She has applied for Medicaid assistance. Objective:    BP 98/64 (BP Location: Left Arm, Patient Position: Sitting, Cuff Size: Large)   Pulse 68   Temp (!) 96.8 F (36 C) (Temporal)   Resp (!) 22   Wt (!) 344 lb 6.4 oz (156.2 kg)   LMP 04/11/2018 (Approximate)   BMI 61.01 kg/m   General:  alert, cooperative and no distress  Abdomen soft, nontender, nondistended.  No right upper quadrant tenderness noted.     Assessment:    Biliary colic, cholelithiasis    Plan:   Once patient has received financial assistance, will proceed with laparoscopic cholecystectomy.  The risks and benefits of the procedure including bleeding, infection, hepatobiliary injury, and the possibility of an open procedure were fully explained to the patient, who gave informed consent.

## 2018-09-07 ENCOUNTER — Other Ambulatory Visit: Payer: Self-pay

## 2018-09-07 ENCOUNTER — Other Ambulatory Visit: Payer: Medicaid Other

## 2018-09-07 DIAGNOSIS — Z20822 Contact with and (suspected) exposure to covid-19: Secondary | ICD-10-CM

## 2018-09-11 LAB — NOVEL CORONAVIRUS, NAA: SARS-CoV-2, NAA: NOT DETECTED

## 2019-01-17 ENCOUNTER — Other Ambulatory Visit: Payer: Self-pay

## 2019-01-17 DIAGNOSIS — Z20822 Contact with and (suspected) exposure to covid-19: Secondary | ICD-10-CM

## 2019-01-19 LAB — NOVEL CORONAVIRUS, NAA: SARS-CoV-2, NAA: NOT DETECTED

## 2019-08-10 ENCOUNTER — Ambulatory Visit: Payer: Medicaid Other | Attending: Internal Medicine

## 2019-08-24 ENCOUNTER — Ambulatory Visit: Payer: Self-pay | Attending: Internal Medicine

## 2019-08-24 DIAGNOSIS — Z23 Encounter for immunization: Secondary | ICD-10-CM

## 2019-08-24 NOTE — Progress Notes (Signed)
   Covid-19 Vaccination Clinic  Name:  Cheryl [name]    MRN: [id number] DOB: 1990-09-21  08/24/2019  [name] was observed post Covid-19 immunization for 15 minutes without incident. She was provided with Vaccine Information Sheet and instruction to access the V-Safe system.   [name] was instructed to call 911 with any severe reactions post vaccine: Marland Kitchen Difficulty breathing  . Swelling of face and throat  . A fast heartbeat  . A bad rash all over body  . Dizziness and weakness   Immunizations Administered    Name Date Dose VIS Date Route   Moderna COVID-19 Vaccine 08/24/2019 10:03 AM 0.5 mL 01/2019 Intramuscular   Manufacturer: Moderna   Lot: [id number]   [place]: 10626-948-54

## 2019-09-28 ENCOUNTER — Ambulatory Visit: Payer: Medicaid Other | Attending: Internal Medicine

## 2019-09-28 DIAGNOSIS — Z23 Encounter for immunization: Secondary | ICD-10-CM

## 2019-09-28 NOTE — Progress Notes (Signed)
   Covid-19 Vaccination Clinic  Name:  [place] A Pagnotta    MRN: [id number] DOB: 25-Feb-1991  09/28/2019  Ms. Barich was observed post Covid-19 immunization for 15 minutes without incident. She was provided with Vaccine Information Sheet and instruction to access the V-Safe system.   Ms. Cuervo was instructed to call 911 with any severe reactions post vaccine: Marland Kitchen Difficulty breathing  . Swelling of face and throat  . A fast heartbeat  . A bad rash all over body  . Dizziness and weakness   Immunizations Administered    Name Date Dose VIS Date Route   Moderna COVID-19 Vaccine 09/28/2019 10:49 AM 0.5 mL 01/2019 Intramuscular   Manufacturer: Moderna   Lot: [id number]   [place]: [id number]

## 2019-12-27 ENCOUNTER — Other Ambulatory Visit: Payer: Self-pay

## 2019-12-27 ENCOUNTER — Encounter (HOSPITAL_COMMUNITY): Payer: Self-pay

## 2019-12-27 ENCOUNTER — Emergency Department (HOSPITAL_COMMUNITY)
Admission: EM | Admit: 2019-12-27 | Discharge: 2019-12-27 | Disposition: A | Discharge status: Home / Self Care | Payer: Medicaid Other | Attending: Emergency Medicine | Admitting: Emergency Medicine

## 2019-12-27 DIAGNOSIS — Z91040 Latex allergy status: Secondary | ICD-10-CM | POA: Insufficient documentation

## 2019-12-27 DIAGNOSIS — R55 Syncope and collapse: Secondary | ICD-10-CM

## 2019-12-27 DIAGNOSIS — D649 Anemia, unspecified: Secondary | ICD-10-CM | POA: Insufficient documentation

## 2019-12-27 DIAGNOSIS — R42 Dizziness and giddiness: Secondary | ICD-10-CM | POA: Insufficient documentation

## 2019-12-27 LAB — CBC
HCT: 29.7 % — ABNORMAL LOW (ref 36.0–46.0)
Hemoglobin: 8.8 g/dL — ABNORMAL LOW (ref 12.0–15.0)
MCH: 26.6 pg (ref 26.0–34.0)
MCHC: 29.6 g/dL — ABNORMAL LOW (ref 30.0–36.0)
MCV: 89.7 fL (ref 80.0–100.0)
Platelets: 490 10*3/uL — ABNORMAL HIGH (ref 150–400)
RBC: 3.31 MIL/uL — ABNORMAL LOW (ref 3.87–5.11)
RDW: 15.6 % — ABNORMAL HIGH (ref 11.5–15.5)
WBC: 9.3 10*3/uL (ref 4.0–10.5)
nRBC: 0.8 % — ABNORMAL HIGH (ref 0.0–0.2)

## 2019-12-27 LAB — BASIC METABOLIC PANEL
Anion gap: 9 (ref 5–15)
BUN: 11 mg/dL (ref 6–20)
CO2: 25 mmol/L (ref 22–32)
Calcium: 9 mg/dL (ref 8.9–10.3)
Chloride: 102 mmol/L (ref 98–111)
Creatinine, Ser: 0.97 mg/dL (ref 0.44–1.00)
GFR, Estimated: >60 mL/min (ref >60)
Glucose, Bld: 105 mg/dL — ABNORMAL HIGH (ref 70–99)
Potassium: 4 mmol/L (ref 3.5–5.1)
Sodium: 136 mmol/L (ref 135–145)

## 2019-12-27 LAB — CBG MONITORING, ED: Glucose-Capillary: 96 mg/dL (ref 70–99)

## 2019-12-27 NOTE — ED Triage Notes (Signed)
Pt to er, pt states that she is here because she passed out at work today. Pt denies chest pain.  Pt states that it was hot and she didn't eat much, states that she has some anemia and she passed out at work

## 2019-12-27 NOTE — ED Provider Notes (Signed)
[facility] EMERGENCY DEPARTMENT Provider Note   CSN: [id number] Arrival date & time: 12/27/19  6283     History Chief Complaint  Patient presents with  . Loss of Consciousness    Cheryl [name] is a 29 y.o. female presenting for evaluation of syncope.   Patient states she was at work when she started to get very hot, lightheaded, and passed out.  She denies injury from syncope.  Since then, she states she felt off and tired, however no longer having dizziness or lightheadedness.  She denies headache, vision changes, slurred speech, numbness, tingling.  She denies recent fevers, chills, chest pain, shortness of breath, cough, nausea, vomiting, urinary symptoms, normal bowel movements.  Patient states she is currently on her period, this has been going on since the beginning of September, almost 2 months. Over the past 3 days, she has not had heavy bleeding, just spotting.  She recently saw an OB/GYN, hemoglobin was found to be between 8.9 and 8.4.  Patient was prescribed iron pills and Megace, but has not yet picked it up.  She is not on blood thinners.  She takes birth control daily, no other medications.  She states she has not had much to eat or drink today, and feels this contributed to her syncopal event.  HPI     Past Medical History:  Diagnosis Date  . Gall stones     Patient Active Problem List   Diagnosis Date Noted  . Biliary colic   . OBESITY 01/02/2008  . IRREGULAR MENSTRUAL CYCLE 01/02/2008    History reviewed. No pertinent surgical history.   OB History   No obstetric history on file.     History reviewed. No pertinent family history.  Social History   Tobacco Use  . Smoking status: Never Smoker  . Smokeless tobacco: Never Used  Vaping Use  . Vaping Use: Never used  Substance Use Topics  . Alcohol use: No  . Drug use: No    Home Medications Prior to Admission medications   Medication Sig Start Date End Date Taking? Authorizing Provider    levonorgestrel-ethinyl estradiol (AVIANE,ALESSE,LESSINA) 0.1-20 MG-MCG tablet Take 1 tablet by mouth at bedtime.    [provider]  ondansetron (ZOFRAN ODT) 4 MG disintegrating tablet Take 1 tablet (4 mg total) by mouth every 8 (eight) hours as needed for nausea or vomiting. 04/23/18   Francine Graven, DO  oxyCODONE-acetaminophen (PERCOCET/ROXICET) 5-325 MG tablet 1 or 2 tabs PO q8h prn pain 04/23/18   Francine Graven, DO    Allergies    Shellfish allergy and Latex  Review of Systems   Review of Systems  Genitourinary: Positive for vaginal bleeding.  Neurological: Positive for syncope.  All other systems reviewed and are negative.   Physical Exam Updated Vital Signs BP 132/86 (BP Location: Right Arm)   Pulse 97   Temp 98.2 F (36.8 C) (Oral)   Resp 18   Ht 5\' 3"  (1.6 m)   Wt (!) 153.8 kg   SpO2 100%   BMI 60.05 kg/m   Physical Exam Vitals and nursing note reviewed.  Constitutional:      General: She is not in acute distress.    Appearance: She is well-developed. She is obese.     Comments: Resting in the bed in no acute distress  HENT:     Head: Normocephalic and atraumatic.  Eyes:     Conjunctiva/sclera: Conjunctivae normal.     Pupils: Pupils are equal, round, and reactive to light.  Cardiovascular:     Rate and Rhythm: Normal rate and regular rhythm.     Pulses: Normal pulses.  Pulmonary:     Effort: Pulmonary effort is normal. No respiratory distress.     Breath sounds: Normal breath sounds. No wheezing.  Abdominal:     General: There is no distension.     Palpations: Abdomen is soft. There is no mass.     Tenderness: There is no abdominal tenderness. There is no guarding or rebound.  Musculoskeletal:        General: Normal range of motion.     Cervical back: Normal range of motion and neck supple.  Skin:    General: Skin is warm and dry.     Capillary Refill: Capillary refill takes less than 2 seconds.  Neurological:     Mental Status: She is  alert and oriented to person, place, and time.     ED Results / Procedures / Treatments   Labs (all labs ordered are listed, but only abnormal results are displayed) Labs Reviewed  BASIC METABOLIC PANEL - Abnormal; Notable for the following components:      Result Value   Glucose, Bld 105 (*)    All other components within normal limits  CBC - Abnormal; Notable for the following components:   RBC 3.31 (*)    Hemoglobin 8.8 (*)    HCT 29.7 (*)    MCHC 29.6 (*)    RDW 15.6 (*)    Platelets 490 (*)    nRBC 0.8 (*)    All other components within normal limits  URINALYSIS, ROUTINE W REFLEX MICROSCOPIC  CBG MONITORING, ED  POC URINE PREG, ED    EKG EKG Interpretation  Date/Time:  Wednesday December 27 2019 18:38:06 EDT Ventricular Rate:  100 PR Interval:  126 QRS Duration: 70 QT Interval:  326 QTC Calculation: 420 R Axis:   84 Text Interpretation: Normal sinus rhythm Normal ECG No old tracing to compare Confirmed by Isla Pence [phone]) on 12/27/2019 9:57:14 PM   Radiology No results found.  Procedures Procedures (including critical care time)  Medications Ordered in ED Medications - No data to display  ED Course  I have reviewed the triage vital signs and the nursing notes.  Pertinent labs & imaging results that were available during my care of the patient were reviewed by me and considered in my medical decision making (see chart for details).    MDM Rules/Calculators/A&P                          Pt presenting for evaluation of syncope. On exam, pt appears nontoxic. She had decreased PO intake, and feels this contributed. Additionally, pt has been on her period for 2 months, likely worsening her anemia. Pt states recent baseline has been 8.9-8.4. labs obtained from triage shows hgb of 8.8, so within pt's recent baseline. Vitals stable. Will order orthostatics to ensure no significant change in VS. If normal, plan for d/c with pt to take megace and iron pills.  Discussed with pt importance of eating and drinking regularly. At this time, pt appears safe for d/c. Return precautions given. Pt states she understands and agrees to plan.   Final Clinical Impression(s) / ED Diagnoses Final diagnoses:  Syncope, unspecified syncope type  Anemia, unspecified type    Rx / DC Orders ED Discharge Orders    None       Franchot Heidelberg, PA-C 12/27/19 2216  Isla Pence, MD 12/27/19 2229

## 2019-12-27 NOTE — Discharge Instructions (Signed)
Make sure you are eating regular meals and staying well hydrated with water It is very important that you start taking the iron pills prescribed to you in the Megace as prescribed by your OB/GYN. Follow-up with your OB/GYN next week for recheck. Return to the emergency room if you develop increased dizziness/lightheadedness, weakness, shortness of breath, recurrent syncope/passing out, with any new, worsening, concerning symptoms.

## 2020-01-09 IMAGING — US US ABDOMEN LIMITED
1 series · 14 of 25 positions shown · non-contrast
Comparison: 06/06/2013

CLINICAL DATA: Right upper quadrant pain.

EXAM:
ULTRASOUND ABDOMEN LIMITED RIGHT UPPER QUADRANT

[Series 1: us abdomen limited · 14 of 59 slices shown]
[im 1/59]
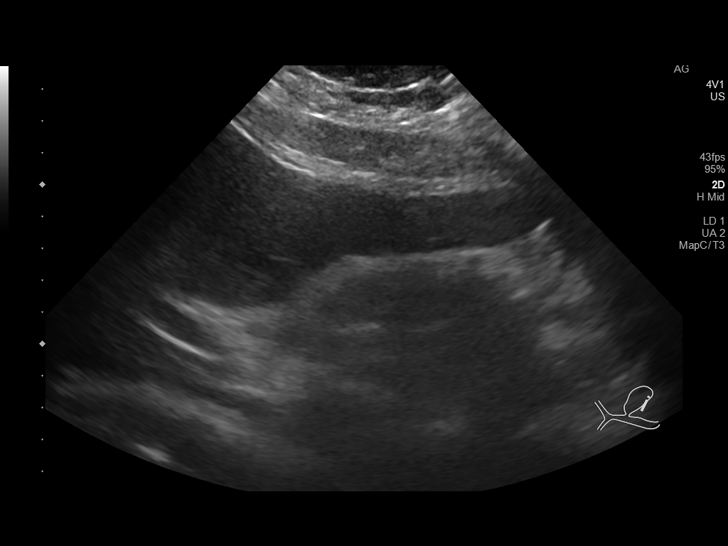
[im 5/59]
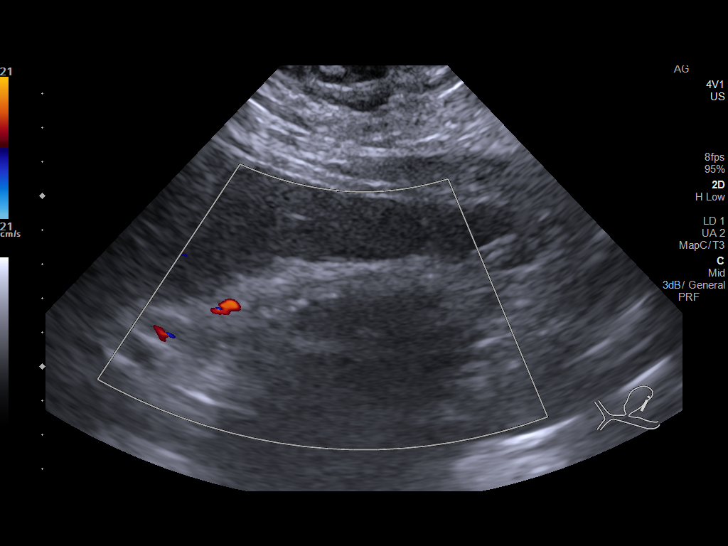
[im 10/59]
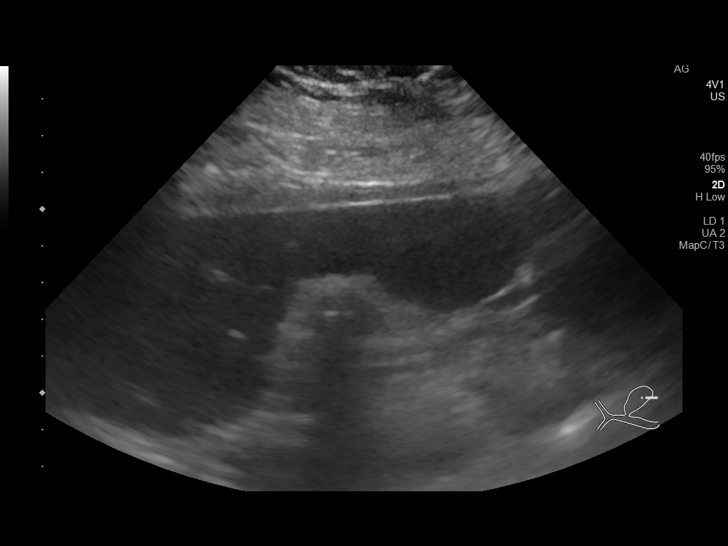
[im 15/59]
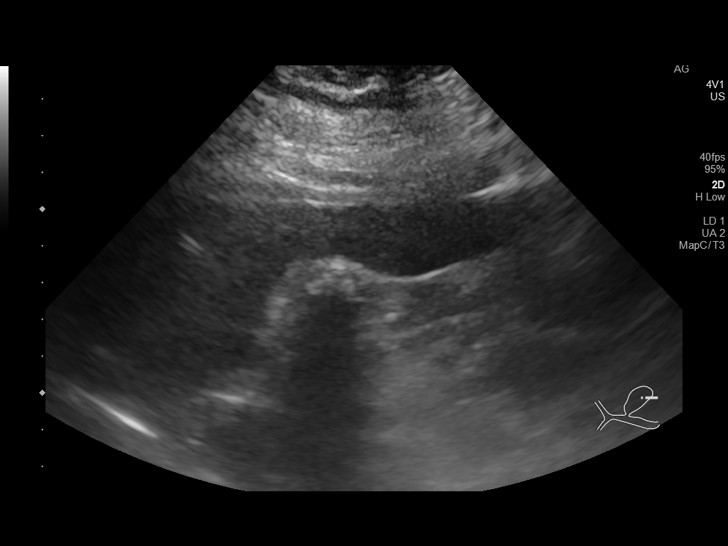
[im 20/59]
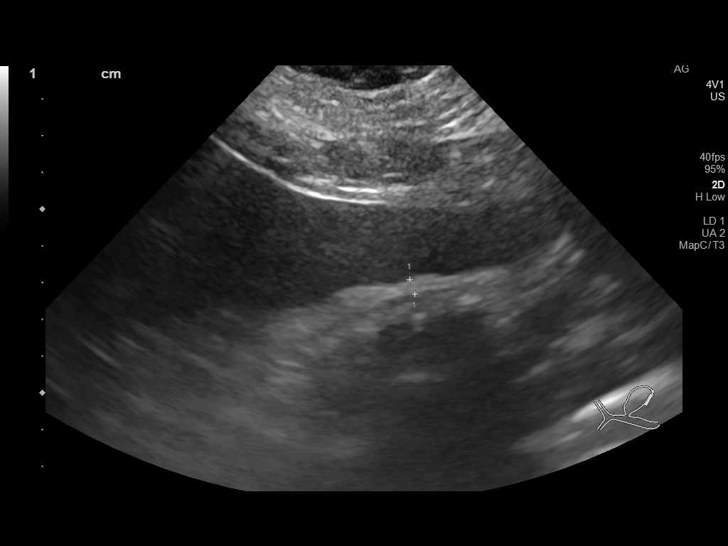
[im 22/59]
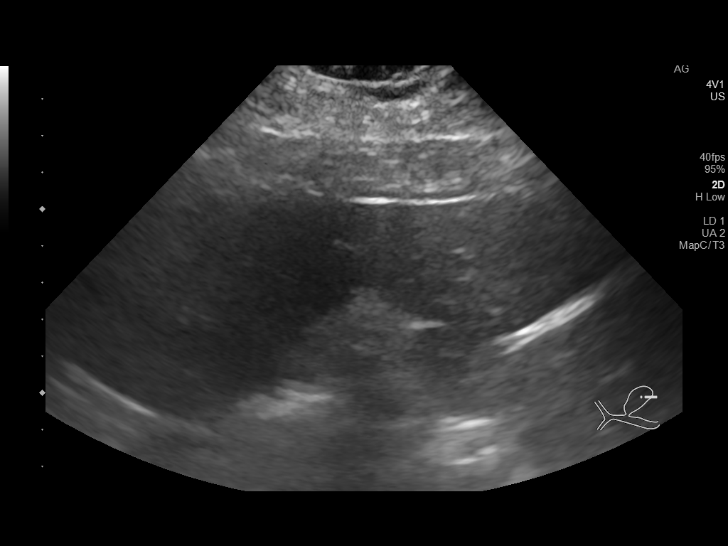
[im 27/59]
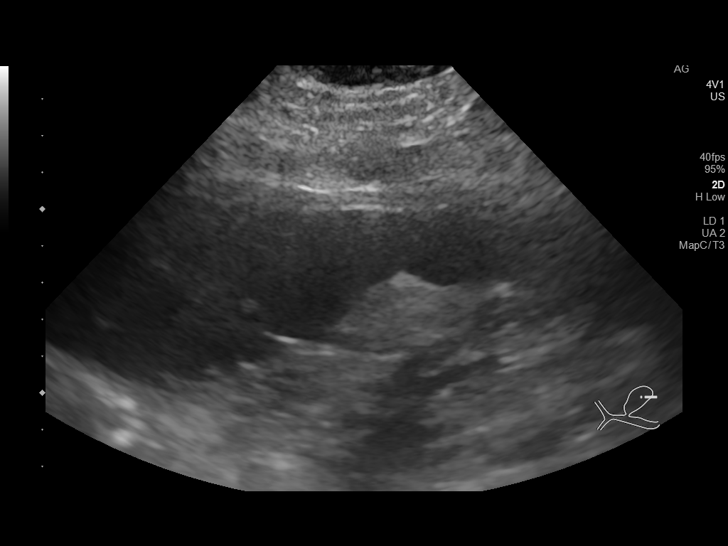
[im 32/59]
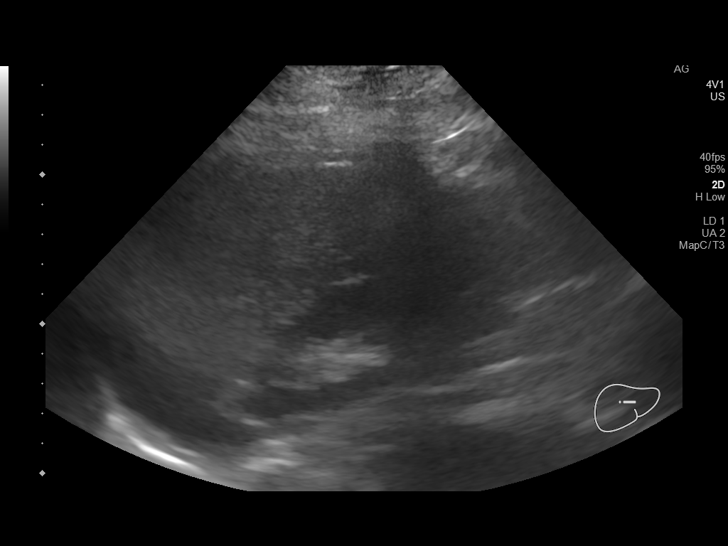
[im 37/59]
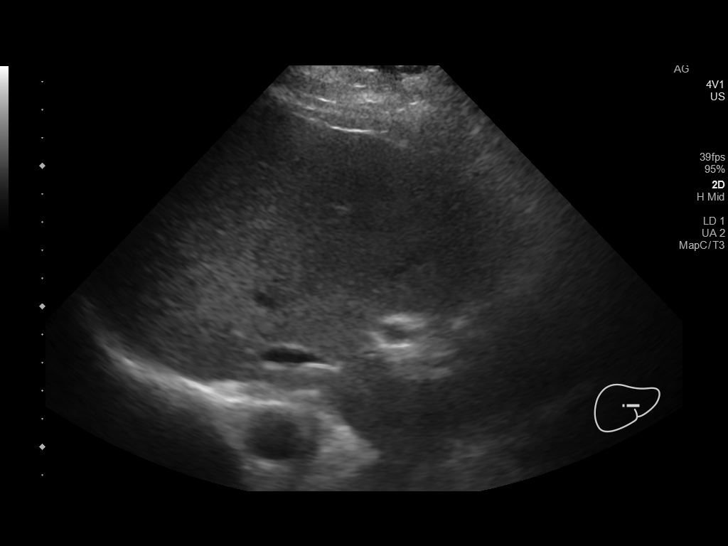
[im 39/59]
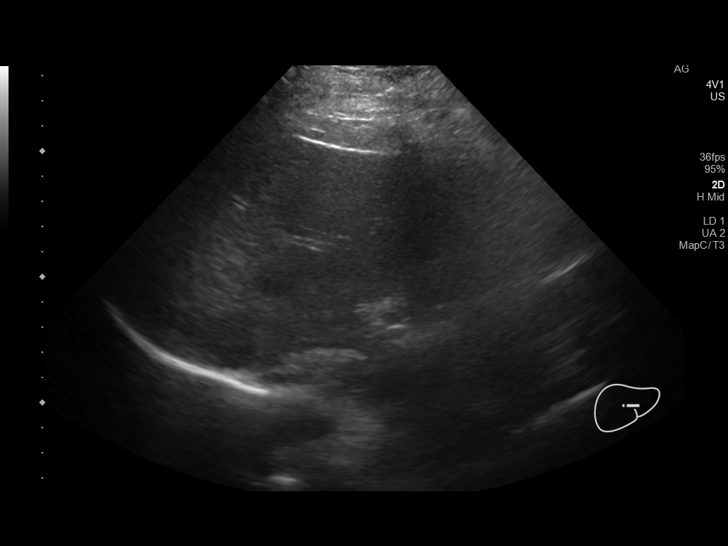
[im 44/59]
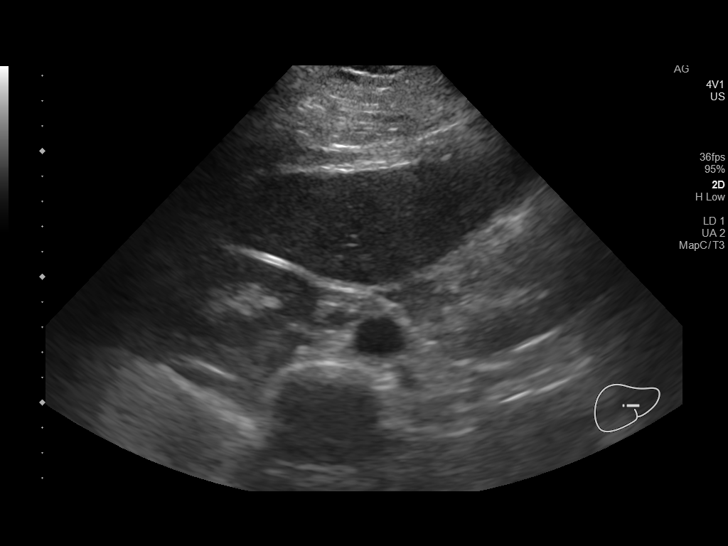
[im 49/59]
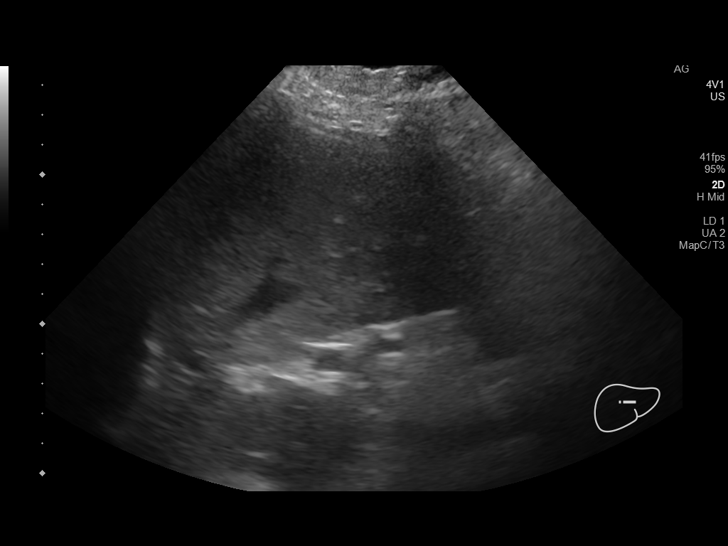
[im 54/59]
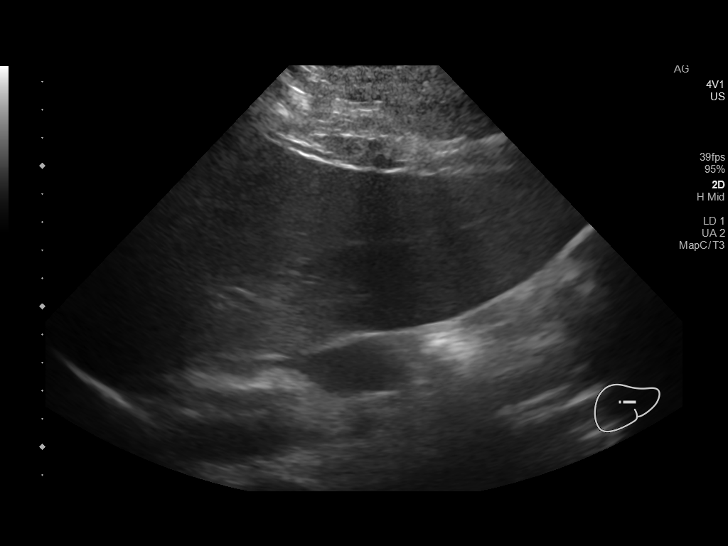
[im 59/59]
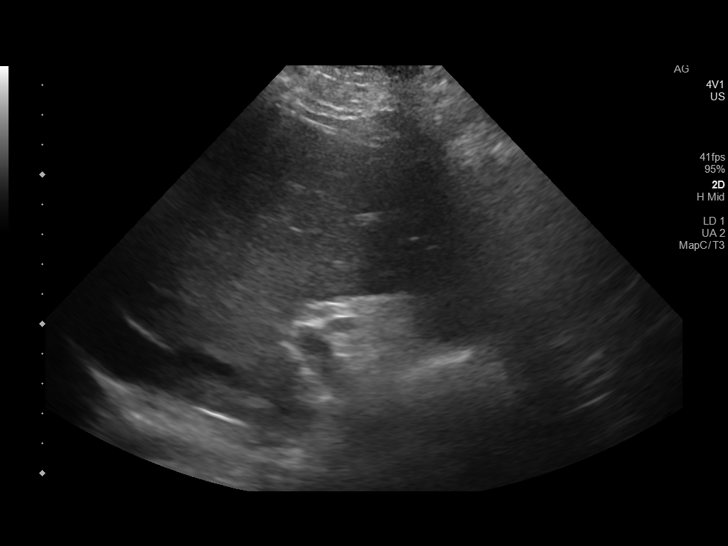

[14 of 25 positions shown; findings below may reference images not displayed]

FINDINGS: Gallbladder:

A "wall echo shadow complex" is identified, most likely representing
a stone filled contracted gallbladder. No pericholecystic fluid.
Upper normal wall thickness, 4 mm. Sonographic Murphy's sign was not
elicited.

Common bile duct:

Diameter: Upper normal for age, 6 mm.

Liver:

No focal lesion identified. Within normal limits in parenchymal
echogenicity. Portal vein is patent on color Doppler imaging with
normal direction of blood flow towards the liver.

Mild degradation secondary to patient's inability to hold still and
body habitus.
IMPRESSION: "Wall echo shadow complex", most typically seen with a stone filled,
contracted gallbladder. No specific evidence of acute inflammation.

Mild degradation, as above.

## 2020-03-21 ENCOUNTER — Encounter (HOSPITAL_COMMUNITY): Payer: Self-pay | Admitting: *Deleted

## 2020-03-21 ENCOUNTER — Emergency Department (HOSPITAL_COMMUNITY)
Admission: EM | Admit: 2020-03-21 | Discharge: 2020-03-21 | Disposition: A | Discharge status: Home / Self Care | Payer: HRSA Program | Attending: Emergency Medicine | Admitting: Emergency Medicine

## 2020-03-21 ENCOUNTER — Other Ambulatory Visit: Payer: Self-pay

## 2020-03-21 DIAGNOSIS — Z91040 Latex allergy status: Secondary | ICD-10-CM | POA: Insufficient documentation

## 2020-03-21 DIAGNOSIS — U071 COVID-19: Secondary | ICD-10-CM | POA: Insufficient documentation

## 2020-03-21 DIAGNOSIS — R059 Cough, unspecified: Secondary | ICD-10-CM | POA: Diagnosis present

## 2020-03-21 LAB — SARS CORONAVIRUS 2 BY RT PCR (HOSPITAL ORDER, PERFORMED IN ~~LOC~~ HOSPITAL LAB): SARS Coronavirus 2: POSITIVE — AB

## 2020-03-21 NOTE — Discharge Instructions (Signed)
Covid is pending  

## 2020-03-21 NOTE — ED Triage Notes (Signed)
Requesting a covid test

## 2020-03-21 NOTE — ED Provider Notes (Signed)
[facility] EMERGENCY DEPARTMENT Provider Note   CSN: [id number] Arrival date & time: 03/21/20  1251     History No chief complaint on file.   Cheryl [name] is a 30 y.o. female.  The history is provided by the patient. No language interpreter was used.  Cough Cough characteristics:  Non-productive Sputum characteristics:  Nondescript Severity:  Moderate Onset quality:  Gradual Timing:  Constant Chronicity:  New Relieved by:  Nothing Worsened by:  Nothing Ineffective treatments:  None tried Associated symptoms: no fever and no shortness of breath    Pt request a covid test.     Past Medical History:  Diagnosis Date  . Gall stones     Patient Active Problem List   Diagnosis Date Noted  . Biliary colic   . OBESITY 01/02/2008  . IRREGULAR MENSTRUAL CYCLE 01/02/2008    No past surgical history on file.   OB History   No obstetric history on file.     No family history on file.  Social History   Tobacco Use  . Smoking status: Never Smoker  . Smokeless tobacco: Never Used  Vaping Use  . Vaping Use: Never used  Substance Use Topics  . Alcohol use: No  . Drug use: No    Home Medications Prior to Admission medications   Medication Sig Start Date End Date Taking? Authorizing Provider  levonorgestrel-ethinyl estradiol (AVIANE,ALESSE,LESSINA) 0.1-20 MG-MCG tablet Take 1 tablet by mouth at bedtime.    [provider]  ondansetron (ZOFRAN ODT) 4 MG disintegrating tablet Take 1 tablet (4 mg total) by mouth every 8 (eight) hours as needed for nausea or vomiting. 04/23/18   Francine Graven, DO  oxyCODONE-acetaminophen (PERCOCET/ROXICET) 5-325 MG tablet 1 or 2 tabs PO q8h prn pain 04/23/18   Francine Graven, DO    Allergies    Shellfish allergy and Latex  Review of Systems   Review of Systems  Constitutional: Negative for fever.  Respiratory: Positive for cough. Negative for shortness of breath.   All other systems reviewed and are  negative.   Physical Exam Updated Vital Signs BP (!) 129/99 (BP Location: Right Wrist)   Pulse 76   Temp 98.3 F (36.8 C)   Resp 20   SpO2 99%   Physical Exam Vitals and nursing note reviewed.  Constitutional:      Appearance: She is well-developed and well-nourished.  HENT:     Head: Normocephalic.  Eyes:     Extraocular Movements: EOM normal.  Cardiovascular:     Rate and Rhythm: Normal rate.  Pulmonary:     Effort: Pulmonary effort is normal.  Abdominal:     General: There is no distension.  Musculoskeletal:        General: Normal range of motion.     Cervical back: Normal range of motion.  Neurological:     Mental Status: She is alert and oriented to person, place, and time.  Psychiatric:        Mood and Affect: Mood and affect and mood normal.     ED Results / Procedures / Treatments   Labs (all labs ordered are listed, but only abnormal results are displayed) Labs Reviewed - No data to display  EKG None  Radiology No results found.  Procedures Procedures (including critical care time)  Medications Ordered in ED Medications - No data to display  ED Course  I have reviewed the triage vital signs and the nursing notes.  Pertinent labs & imaging results that were available  during my care of the patient were reviewed by me and considered in my medical decision making (see chart for details).    MDM Rules/Calculators/A&P                          MDM: covid pending Final Clinical Impression(s) / ED Diagnoses Final diagnoses:  Cough    Rx / DC Orders ED Discharge Orders    None    An After Visit Summary was printed and given to the patient.    Fransico Meadow, [place] 03/21/20 1443    Isla Pence, MD 03/21/20 [phone]

## 2020-03-23 ENCOUNTER — Encounter: Payer: Self-pay | Admitting: Oncology

## 2020-03-23 ENCOUNTER — Telehealth (HOSPITAL_COMMUNITY): Payer: Self-pay | Admitting: Oncology

## 2020-03-23 NOTE — Telephone Encounter (Signed)
Called to discuss with patient about COVID-19 symptoms and the use of one of the available treatments for those with mild to moderate Covid symptoms and at a high risk of hospitalization.  Pt appears to qualify for outpatient treatment due to co-morbid conditions and/or a member of an at-risk group in accordance with the FDA Emergency Use Authorization.    Symptom onset: Unsure  Vaccinated: Yes Booster? unsure Immunocompromised? No  Qualifiers: Obesity   Unable to reach pt - Left vm and MCM   Cheryl [name]

## 2022-05-01 ENCOUNTER — Other Ambulatory Visit: Payer: Self-pay

## 2022-05-01 ENCOUNTER — Emergency Department (HOSPITAL_COMMUNITY)
Admission: EM | Admit: 2022-05-01 | Discharge: 2022-05-01 | Disposition: A | Discharge status: Home / Self Care | Payer: Medicaid Other | Attending: Emergency Medicine | Admitting: Emergency Medicine

## 2022-05-01 ENCOUNTER — Encounter (HOSPITAL_COMMUNITY): Payer: Self-pay | Admitting: *Deleted

## 2022-05-01 DIAGNOSIS — I160 Hypertensive urgency: Secondary | ICD-10-CM | POA: Diagnosis not present

## 2022-05-01 DIAGNOSIS — G8929 Other chronic pain: Secondary | ICD-10-CM | POA: Insufficient documentation

## 2022-05-01 DIAGNOSIS — I10 Essential (primary) hypertension: Secondary | ICD-10-CM | POA: Diagnosis not present

## 2022-05-01 DIAGNOSIS — M545 Low back pain, unspecified: Secondary | ICD-10-CM | POA: Diagnosis not present

## 2022-05-01 DIAGNOSIS — M5459 Other low back pain: Secondary | ICD-10-CM | POA: Diagnosis not present

## 2022-05-01 DIAGNOSIS — Z91040 Latex allergy status: Secondary | ICD-10-CM | POA: Diagnosis not present

## 2022-05-01 MED ORDER — METHOCARBAMOL 1000 MG PO TABS: 1000.0000 mg | ORAL_TABLET | Freq: Three times a day (TID) | ORAL | 0 refills | Status: DC | PRN

## 2022-05-01 MED ORDER — KETOROLAC TROMETHAMINE 30 MG/ML IJ SOLN
30.0000 mg | Freq: Once | INTRAMUSCULAR | Status: AC
  Administered 2022-05-01: 30 mg via INTRAMUSCULAR
  Filled 2022-05-01: qty 1

## 2022-05-01 NOTE — ED Provider Notes (Signed)
[facility] Provider Note   CSN: [id number] Arrival date & time: 05/01/22  [id number]     History  Chief Complaint  Patient presents with   Back Pain    Cheryl [name] is a 32 y.o. female.  HPI 32 year old female presents with about 2 months of low back pain.  Its midline and right-sided.  No injuries.  She has not had any fevers, incontinence, or numbness or weakness in her legs.  She denies any illicit drug use.  She has not had any abdominal pain, urinary symptoms.  She denies any chance she is pregnant.  Moving her legs makes her pain worse.  Previously her pain seem to be worse at the end of the day after work but over the last 24-48 hours is now more constant.  When she moves a certain way will become sharp.  Otherwise there is some level of pain in general.  She has not tried ibuprofen though is concerned because it has not helped her with different pains in the past.  She tried some IcyHot but did not seem to help.  Home Medications Prior to Admission medications   Medication Sig Start Date End Date Taking? Authorizing Provider  methocarbamol 1000 MG TABS Take 1,000 mg by mouth every 8 (eight) hours as needed for muscle spasms. 05/01/22  Yes Sherwood Gambler, MD  levonorgestrel-ethinyl estradiol (AVIANE,ALESSE,LESSINA) 0.1-20 MG-MCG tablet Take 1 tablet by mouth at bedtime.    [provider]  ondansetron (ZOFRAN ODT) 4 MG disintegrating tablet Take 1 tablet (4 mg total) by mouth every 8 (eight) hours as needed for nausea or vomiting. 04/23/18   Francine Graven, DO  oxyCODONE-acetaminophen (PERCOCET/ROXICET) 5-325 MG tablet 1 or 2 tabs PO q8h prn pain 04/23/18   Francine Graven, DO      Allergies    Shellfish allergy and Latex    Review of Systems   Review of Systems  Constitutional:  Negative for fever.  Cardiovascular:  Negative for chest pain.  Gastrointestinal:  Negative for abdominal pain.  Genitourinary:  Negative for  dysuria and menstrual problem.  Musculoskeletal:  Positive for back pain.  Neurological:  Negative for weakness, numbness and headaches.    Physical Exam Updated Vital Signs BP (!) 174/91 (BP Location: Left Wrist)   Pulse 90   Temp 98.4 F (36.9 C) (Oral)   Resp 16   Ht '5\' 3"'$  (1.6 m)   Wt (!) 154.2 kg   SpO2 98%   BMI 60.23 kg/m  Physical Exam Vitals and nursing note reviewed.  Constitutional:      Appearance: She is well-developed. She is obese.  HENT:     Head: Normocephalic and atraumatic.  Cardiovascular:     Rate and Rhythm: Normal rate and regular rhythm.     Heart sounds: Normal heart sounds.  Pulmonary:     Effort: Pulmonary effort is normal.     Breath sounds: Normal breath sounds.  Abdominal:     Palpations: Abdomen is soft.     Tenderness: There is no abdominal tenderness.  Musculoskeletal:     Thoracic back: No tenderness.     Lumbar back: Tenderness present.  Skin:    General: Skin is warm and dry.  Neurological:     Mental Status: She is alert.     Comments: 5/5 strength in BLE. Grossly normal sensation.     ED Results / Procedures / Treatments   Labs (all labs ordered are listed, but only  abnormal results are displayed) Labs Reviewed - No data to display  EKG None  Radiology No results found.  Procedures Procedures    Medications Ordered in ED Medications  ketorolac (TORADOL) 30 MG/ML injection 30 mg (30 mg Intramuscular Given 05/01/22 0855)    ED Course/ Medical Decision Making/ A&P                             Medical Decision Making Amount and/or Complexity of Data Reviewed External Data Reviewed: notes.  Risk Prescription drug management.   Patient presents with back pain for 2 months.  Seems worse recently but otherwise no red flag symptoms.  Very low suspicion that she has a spinal cord, intra-abdominal or retroperitoneal emergency.  She is hypertensive in triage though on recheck in the room she is now in the [id number] range  systolic.  I discussed she will need to get this followed up with her PCP and she can check it at home as well.  Otherwise, she is not showing any symptoms of hypertensive emergency.  I do not think lab work is needed.  I did offer to check a pregnancy test but she states that she is not sexually active and declines this.  Otherwise I do not think imaging including x-ray or MRI would be helpful.  This is likely a muscular issue.  We discussed losing weight, exercises, NSAIDs, Tylenol and will give short course of muscle relaxer to see if this helps.  Otherwise, we will have her follow-up with her PCP and consider physical therapy.  Will give return precautions.        Final Clinical Impression(s) / ED Diagnoses Final diagnoses:  Chronic right-sided low back pain without sciatica  Hypertensive urgency    Rx / DC Orders ED Discharge Orders          Ordered    methocarbamol 1000 MG TABS  Every 8 hours PRN        05/01/22 0851              Sherwood Gambler, MD 05/01/22 [phone]

## 2022-05-01 NOTE — ED Triage Notes (Signed)
Pt reports lower back pain for the last 1-2 months more on rt side. Has tried OTC meds without relief. Some positions make it worse.Denies urinary symptoms

## 2022-05-01 NOTE — Discharge Instructions (Signed)
You may take ibuprofen and Tylenol to help with your back pain.  You are being given a muscle relaxer to also help.  Be careful as this can cause dizziness, lightheadedness, off-balance sensation, fatigue, or other potential side effects.  Do not take this in combination with other medicines or alcohol.  Do not drive or operate heavy machinery with this.  Follow-up with your primary care physician for both your back pain as they can potentially refer you to physical therapy as well as to get your blood pressure rechecked.  Your blood pressure was elevated today and this needs to be followed up as an outpatient.  Losing weight and exercise can also help with your back. You are also being given some exercises to try to see if this helps your back as well.  If you develop worsening, recurrent, or continued back pain, numbness or weakness in the legs, incontinence of your bowels or bladders, numbness of your buttocks, fever, abdominal pain, or any other new/concerning symptoms then return to the ER for evaluation.

## 2023-01-11 ENCOUNTER — Other Ambulatory Visit (HOSPITAL_COMMUNITY): Payer: Self-pay

## 2023-01-11 ENCOUNTER — Emergency Department (HOSPITAL_COMMUNITY)
Admission: EM | Admit: 2023-01-11 | Discharge: 2023-01-11 | Disposition: A | Discharge status: Home / Self Care | Payer: 59 | Attending: Emergency Medicine | Admitting: Emergency Medicine

## 2023-01-11 ENCOUNTER — Encounter (HOSPITAL_COMMUNITY): Payer: Self-pay | Admitting: Emergency Medicine

## 2023-01-11 DIAGNOSIS — Z91040 Latex allergy status: Secondary | ICD-10-CM | POA: Diagnosis not present

## 2023-01-11 DIAGNOSIS — D649 Anemia, unspecified: Secondary | ICD-10-CM | POA: Insufficient documentation

## 2023-01-11 DIAGNOSIS — R531 Weakness: Secondary | ICD-10-CM | POA: Diagnosis not present

## 2023-01-11 DIAGNOSIS — D509 Iron deficiency anemia, unspecified: Secondary | ICD-10-CM | POA: Diagnosis not present

## 2023-01-11 DIAGNOSIS — R519 Headache, unspecified: Secondary | ICD-10-CM | POA: Diagnosis not present

## 2023-01-11 LAB — COMPREHENSIVE METABOLIC PANEL
ALT: 14 U/L (ref 0–44)
AST: 16 U/L (ref 15–41)
Albumin: 3.3 g/dL — ABNORMAL LOW (ref 3.5–5.0)
Alkaline Phosphatase: 57 U/L (ref 38–126)
Anion gap: 8 (ref 5–15)
BUN: 8 mg/dL (ref 6–20)
CO2: 25 mmol/L (ref 22–32)
Calcium: 8.9 mg/dL (ref 8.9–10.3)
Chloride: 104 mmol/L (ref 98–111)
Creatinine, Ser: 1 mg/dL (ref 0.44–1.00)
GFR, Estimated: >60 mL/min (ref >60)
Glucose, Bld: 120 mg/dL — ABNORMAL HIGH (ref 70–99)
Potassium: 3.5 mmol/L (ref 3.5–5.1)
Sodium: 137 mmol/L (ref 135–145)
Total Bilirubin: 0.3 mg/dL (ref <1.2)
Total Protein: 7.6 g/dL (ref 6.5–8.1)

## 2023-01-11 LAB — CBC WITH DIFFERENTIAL/PLATELET
Abs Immature Granulocytes: 0.16 10*3/uL — ABNORMAL HIGH (ref 0.00–0.07)
Basophils Absolute: 0.1 10*3/uL (ref 0.0–0.1)
Basophils Relative: 1 %
Eosinophils Absolute: 0.4 10*3/uL (ref 0.0–0.5)
Eosinophils Relative: 5 %
HCT: 28.4 % — ABNORMAL LOW (ref 36.0–46.0)
Hemoglobin: 8.1 g/dL — ABNORMAL LOW (ref 12.0–15.0)
Immature Granulocytes: 2 %
Lymphocytes Relative: 34 %
Lymphs Abs: 3.2 10*3/uL (ref 0.7–4.0)
MCH: 22.6 pg — ABNORMAL LOW (ref 26.0–34.0)
MCHC: 28.5 g/dL — ABNORMAL LOW (ref 30.0–36.0)
MCV: 79.1 fL — ABNORMAL LOW (ref 80.0–100.0)
Monocytes Absolute: 0.7 10*3/uL (ref 0.1–1.0)
Monocytes Relative: 8 %
Neutro Abs: 4.7 10*3/uL (ref 1.7–7.7)
Neutrophils Relative %: 50 %
Platelets: 518 10*3/uL — ABNORMAL HIGH (ref 150–400)
RBC: 3.59 MIL/uL — ABNORMAL LOW (ref 3.87–5.11)
RDW: 15.9 % — ABNORMAL HIGH (ref 11.5–15.5)
WBC: 9.2 10*3/uL (ref 4.0–10.5)
nRBC: 1.8 % — ABNORMAL HIGH (ref 0.0–0.2)

## 2023-01-11 LAB — URINALYSIS, ROUTINE W REFLEX MICROSCOPIC
Bacteria, UA: NONE SEEN
Bilirubin Urine: NEGATIVE
Glucose, UA: NEGATIVE mg/dL
Hgb urine dipstick: NEGATIVE
Ketones, ur: NEGATIVE mg/dL
Leukocytes,Ua: NEGATIVE
Nitrite: NEGATIVE
Protein, ur: 30 mg/dL — AB
Specific Gravity, Urine: 1.03 (ref 1.005–1.030)
pH: 5 (ref 5.0–8.0)

## 2023-01-11 LAB — LIPASE, BLOOD: Lipase: 26 U/L (ref 11–51)

## 2023-01-11 LAB — HCG, SERUM, QUALITATIVE: Preg, Serum: NEGATIVE

## 2023-01-11 MED ORDER — DIPHENHYDRAMINE HCL 25 MG PO CAPS
25.0000 mg | ORAL_CAPSULE | Freq: Once | ORAL | Status: AC
  Administered 2023-01-11: 25 mg via ORAL
  Filled 2023-01-11: qty 1

## 2023-01-11 MED ORDER — DIPHENHYDRAMINE HCL 50 MG/ML IJ SOLN
25.0000 mg | Freq: Once | INTRAMUSCULAR | Status: DC
  Filled 2023-01-11: qty 1

## 2023-01-11 MED ORDER — SODIUM CHLORIDE 0.9 % IV BOLUS: 1000.0000 mL | Freq: Once | INTRAVENOUS | Status: DC

## 2023-01-11 MED ORDER — METOCLOPRAMIDE HCL 5 MG/ML IJ SOLN
10.0000 mg | Freq: Once | INTRAMUSCULAR | Status: DC
  Filled 2023-01-11: qty 2

## 2023-01-11 MED ORDER — FERROUS SULFATE 325 (65 FE) MG PO TABS
325.0000 mg | ORAL_TABLET | ORAL | 0 refills | Status: DC
  Filled 2023-01-11: qty 30, 60d supply, fill #0

## 2023-01-11 MED ORDER — METOCLOPRAMIDE HCL 10 MG PO TABS
5.0000 mg | ORAL_TABLET | Freq: Once | ORAL | Status: AC
  Administered 2023-01-11: 5 mg via ORAL
  Filled 2023-01-11: qty 1

## 2023-01-11 NOTE — Discharge Instructions (Addendum)
I am suspicious that you have iron deficiency anemia secondary from your heavy periods.  Please follow-up with a gynecologist, I provided you information for gynecologist please call the office, and make an appointment.  We will start you on iron to help supplement, your iron deficiency.  If you have severe weakness, confusion, passout, or short of breath please return to the ER

## 2023-01-11 NOTE — ED Notes (Signed)
Went over pt. Discharged instructions and pt verbalized understanding. Pt reports tolerable pain and denies any further questions. Pt. Went to go get meds from pharmacy and endorses ride home. Pt. Walked to elevator with stable gait.

## 2023-01-11 NOTE — ED Triage Notes (Signed)
Pt here from work c/o gen weakness and not felling well over the last week , pt states that she did have a heavier than normal menstrual cycle along with h/a

## 2023-01-11 NOTE — ED Notes (Signed)
Pt verbalized understanding of discharge instructions. Pt ambulated from ed with steady gait.  

## 2023-01-11 NOTE — ED Provider Notes (Signed)
[facility] EMERGENCY DEPARTMENT AT [facility] Provider Note   CSN: [id number] Arrival date & time: 01/11/23  1038     History  Chief Complaint  Patient presents with   Weakness    Cheryl [name] is a 32 y.o. female, hx of irregular menstrual cycle, who presents to the ED secondary to feeling weak for the last few weeks.  She feels like she is just generally weak, has had intermittent headaches, on and off.  She notes this all happened after a heavy menstrual cycle, about 2 weeks ago.  Just has not felt right since then.  Denies any kind of nausea, vomiting, chest pain, shortness of breath, abdominal pain.  No head injuries, no weakness in one-sided the body, headache is all over her head, and comes and goes.  Alleviated by Tylenol.  Reports okay p.o. intake. Home Medications Prior to Admission medications   Medication Sig Start Date End Date Taking? Authorizing Provider  acetaminophen (TYLENOL) 500 MG tablet Take 1,000 mg by mouth 2 (two) times daily as needed for moderate pain (pain score 4-6) or headache.   Yes [provider]  ferrous sulfate 325 (65 FE) MG tablet Take 1 tablet (325 mg total) by mouth every other day. 01/11/23  Yes Jeremia Groot L, PA  ibuprofen (ADVIL) 200 MG tablet Take 400 mg by mouth 2 (two) times daily as needed for moderate pain (pain score 4-6) or headache.   Yes [provider]      Allergies    Shellfish allergy and Latex    Review of Systems   Review of Systems  Constitutional:  Negative for fever.  Neurological:  Positive for weakness.    Physical Exam Updated Vital Signs BP (!) 116/102 (BP Location: Right Arm)   Pulse 92   Temp 98.1 F (36.7 C)   Resp 17   LMP 12/30/2022 (Approximate)   SpO2 100%  Physical Exam Vitals and nursing note reviewed.  Constitutional:      General: She is not in acute distress.    Appearance: She is well-developed.  HENT:     Head: Normocephalic and atraumatic.  Eyes:      Conjunctiva/sclera: Conjunctivae normal.  Cardiovascular:     Rate and Rhythm: Normal rate and regular rhythm.     Heart sounds: No murmur heard. Pulmonary:     Effort: Pulmonary effort is normal. No respiratory distress.     Breath sounds: Normal breath sounds.  Abdominal:     Palpations: Abdomen is soft.     Tenderness: There is no abdominal tenderness.  Musculoskeletal:        General: No swelling.     Cervical back: Neck supple.  Skin:    General: Skin is warm and dry.     Capillary Refill: Capillary refill takes less than 2 seconds.  Neurological:     Mental Status: She is alert.  Psychiatric:        Mood and Affect: Mood normal.     ED Results / Procedures / Treatments   Labs (all labs ordered are listed, but only abnormal results are displayed) Labs Reviewed  COMPREHENSIVE METABOLIC PANEL - Abnormal; Notable for the following components:      Result Value   Glucose, Bld 120 (*)    Albumin 3.3 (*)    All other components within normal limits  CBC WITH DIFFERENTIAL/PLATELET - Abnormal; Notable for the following components:   RBC 3.59 (*)    Hemoglobin 8.1 (*)  HCT 28.4 (*)    MCV 79.1 (*)    MCH 22.6 (*)    MCHC 28.5 (*)    RDW 15.9 (*)    Platelets 518 (*)    nRBC 1.8 (*)    Abs Immature Granulocytes 0.16 (*)    All other components within normal limits  URINALYSIS, ROUTINE W REFLEX MICROSCOPIC - Abnormal; Notable for the following components:   APPearance HAZY (*)    Protein, ur 30 (*)    All other components within normal limits  LIPASE, BLOOD  HCG, SERUM, QUALITATIVE    EKG None  Radiology No results found.  Procedures Procedures    Medications Ordered in ED Medications  metoCLOPramide (REGLAN) tablet 5 mg (5 mg Oral Given 01/11/23 1321)  diphenhydrAMINE (BENADRYL) capsule 25 mg (25 mg Oral Given 01/11/23 1321)    ED Course/ Medical Decision Making/ A&P Clinical Course as of 01/11/23 1425  Mon Jan 11, 2023  1309 Hemoglobin(!):  8.1 Similar to prior  [SG]    Clinical Course User Index [SG] Sloan Leiter, DO                                 Medical Decision Making Patient is a 32 year old female, here for weakness, headache has been got going on now.  States it happened after a heavy menstrual cycle about 2 weeks ago.  Denies any current vaginal bleeding.  She is well-appearing, not pale not ill-appearing.  States that she had an intermittent headache, but 1 that is not constant.  She has no neurodeficits on my exam, thus no need to obtain a head CT at this time.  Especially since the headache resolves at times.  We will give her IV fluids, and medicine to help with her headache, and obtain CBC, CMP, and urinalysis as well as a beta-hCG for further evaluation she has no fever, chills, shortness of breath, chest pain, or abdominal pain.  Updated by nursing, patient unable to receive IV fluids, given inability to get line, I ordered p.o. fluids, for the patient, to help hydrate  Amount and/or Complexity of Data Reviewed Labs: ordered. Decision-making details documented in ED Course.    Details: Hemoglobin of 8.1, microcytic anemia, no acute other abnormalities Discussion of management or test interpretation with external provider(s): Discussed with him, I believe that her symptoms of weakness, headache, is likely secondary to her microcytic anemia, given her heavy vaginal bleeding, believe is likely secondary to iron deficiency.  Will start her on iron, and have her follow-up with gynecology, as she has persistent heavy periods.  May be need to put on birth control, to help regulate these.  She is well-appearing is not have any syncopal episodes or shortness of breath, and does not require transfusion at this time.  Discussed return precautions she voiced understanding discharged home  Risk OTC drugs. Prescription drug management.   Final Clinical Impression(s) / ED Diagnoses Final diagnoses:  Microcytic anemia   Weakness  Acute nonintractable headache, unspecified headache type    Rx / DC Orders ED Discharge Orders          Ordered    ferrous sulfate 325 (65 FE) MG tablet  Every other day        01/11/23 1418              Enslee Bibbins L, PA 01/11/23 1429    Sloan Leiter, DO 01/12/23 [phone]

## 2023-02-11 ENCOUNTER — Ambulatory Visit (INDEPENDENT_AMBULATORY_CARE_PROVIDER_SITE_OTHER): Payer: 59 | Admitting: Family Medicine

## 2023-02-11 ENCOUNTER — Encounter (HOSPITAL_BASED_OUTPATIENT_CLINIC_OR_DEPARTMENT_OTHER): Payer: Self-pay | Admitting: Family Medicine

## 2023-02-11 VITALS — BP 116/82 | HR 95 | Ht 63.0 in | Wt 347.6 lb

## 2023-02-11 DIAGNOSIS — D509 Iron deficiency anemia, unspecified: Secondary | ICD-10-CM | POA: Diagnosis not present

## 2023-02-11 DIAGNOSIS — Z23 Encounter for immunization: Secondary | ICD-10-CM

## 2023-02-11 DIAGNOSIS — Z7689 Persons encountering health services in other specified circumstances: Secondary | ICD-10-CM

## 2023-02-11 DIAGNOSIS — Z3009 Encounter for other general counseling and advice on contraception: Secondary | ICD-10-CM | POA: Diagnosis not present

## 2023-02-11 NOTE — Progress Notes (Signed)
New Patient Office Visit  Subjective:   Cheryl [name] 12-Aug-1990 02/11/2023  Chief Complaint  Patient presents with   Establish Care    HPI: Cheryl A Oyster presents today to establish care at [facility]. Introduced to Publishing rights manager role and practice setting.  All questions answered.   Last PCP: None Last annual physical: n/a  Concerns: See below   IDA:  Cheryl [name] presents for the medical management of Anemia. Patient presented to ED on 01/11/23 w/ fatigue, headache, and Hgb of 8.1. Reports significant heavy periods. She was started on ferrous sulfate replacement and she is taking daily.  Denies any side effects from iron replacement.  She states menstrual cycles are irregular. She has been on OCP in the past and did not notice improvement in menstrual cycles.  She is interested in IUD.   Patient's last menstrual period was 02/01/2023 (exact date).  Denies bloody stools, hematuria, excessive fatigue, palpitations, pica.  Patient is not seeing hematology for management.    Lab Results  Component Value Date   WBC 9.2 01/11/2023   HGB 8.1 (L) 01/11/2023   HCT 28.4 (L) 01/11/2023   MCV 79.1 (L) 01/11/2023   PLT 518 (H) 01/11/2023    No results found for: "IRON", "TIBC", "FERRITIN"    The following portions of the patient's history were reviewed and updated as appropriate: past medical history, past surgical history, family history, social history, allergies, medications, and problem list.   Patient Active Problem List   Diagnosis Date Noted   Biliary colic    OBESITY 01/02/2008   IRREGULAR MENSTRUAL CYCLE 01/02/2008   Past Medical History:  Diagnosis Date   Allergy 2018   Anemia    Anxiety    Depression    Gall stones    History reviewed. No pertinent surgical history. Family History  Problem Relation Age of Onset   Depression Mother    Diabetes Mother    Obesity Mother    Diabetes  Maternal Grandmother    Heart disease Maternal Grandmother    Obesity Maternal Grandmother    Social History   Socioeconomic History   Marital status: Single    Spouse name: Not on file   Number of children: Not on file   Years of education: Not on file   Highest education level: Associate degree: occupational, Scientist, product/process development, or vocational program  Occupational History   Not on file  Tobacco Use   Smoking status: Never   Smokeless tobacco: Never  Vaping Use   Vaping status: Never Used  Substance and Sexual Activity   Alcohol use: No   Drug use: No   Sexual activity: Yes    Birth control/protection: Condom  Other Topics Concern   Not on file  Social History Narrative   Not on file   Social Drivers of Health   Financial Resource Strain: Low Risk  (02/09/2023)   Overall Financial Resource Strain (CARDIA)    Difficulty of Paying Living Expenses: Not hard at all  Food Insecurity: No Food Insecurity (02/09/2023)   Hunger Vital Sign    Worried About Running Out of Food in the Last Year: Never true    Ran Out of Food in the Last Year: Never true  Transportation Needs: Unmet Transportation Needs (02/09/2023)   PRAPARE - Transportation    Lack of Transportation (Medical): Yes    Lack of Transportation (Non-Medical): Yes  Physical Activity: Sufficiently Active (02/09/2023)   Exercise Vital  Sign    Days of Exercise per Week: 5 days    Minutes of Exercise per Session: 150+ min  Stress: No Stress Concern Present (02/09/2023)   [employer] of Occupational Health - Occupational Stress Questionnaire    Feeling of Stress : Only a little  Social Connections: Socially Isolated (02/09/2023)   Social Connection and Isolation Panel [NHANES]    Frequency of Communication with Friends and Family: Once a week    Frequency of Social Gatherings with Friends and Family: Never    Attends Religious Services: Never    Database administrator or Organizations: No    Attends Museum/gallery exhibitions officer: Not on file    Marital Status: Never married  Intimate Partner Violence: Not on file   Outpatient Medications Prior to Visit  Medication Sig Dispense Refill   acetaminophen (TYLENOL) 500 MG tablet Take 1,000 mg by mouth 2 (two) times daily as needed for moderate pain (pain score 4-6) or headache.     ferrous sulfate 325 (65 FE) MG tablet Take 1 tablet (325 mg total) by mouth every other day. 30 tablet 0   ibuprofen (ADVIL) 200 MG tablet Take 400 mg by mouth 2 (two) times daily as needed for moderate pain (pain score 4-6) or headache.     No facility-administered medications prior to visit.   Allergies  Allergen Reactions   Shellfish Allergy Shortness Of Breath   Latex Itching and Rash    Powder inside the glove    ROS: A complete ROS was performed with pertinent positives/negatives noted in the HPI. The remainder of the ROS are negative.   Objective:   Today's Vitals   02/11/23 1456  BP: 116/82  Pulse: 95  SpO2: 98%  Weight: (!) 347 lb 9.6 oz (157.7 kg)  Height: 5\' 3"  (1.6 m)    GENERAL: Well-appearing, in NAD. Well nourished.  SKIN: Pink, warm and dry.  Head: Normocephalic. NECK: Trachea midline. Full ROM w/o pain or tenderness.  RESPIRATORY: Chest wall symmetrical. Respirations even and non-labored. Breath sounds clear to auscultation bilaterally.  CARDIAC: S1, S2 present, regular rate and rhythm without murmur or gallops. Peripheral pulses 2+ bilaterally.  MSK: Muscle tone and strength appropriate for age.  NEUROLOGIC: No motor or sensory deficits. Steady, even gait. C2-C12 intact.  PSYCH/MENTAL STATUS: Alert, oriented x 3. Cooperative, appropriate mood and affect.    Health Maintenance Due  Topic Date Due   Cervical Cancer Screening (HPV/Pap Cotest)  02/27/2019      Assessment & Plan:  1. Encounter to establish care with new doctor (Primary) Discussed role of PCP and need for annual exam for baseline of health, patient will return for  fasting labs and AE coming up.  2. Counseling for birth control regarding intrauterine device (IUD) Referral placed to OB/GYN for IUD discussion and management.  Patient would benefit greatly from contraceptive due to heavy menstrual bleeding and iron deficiency anemia from chronic blood loss. - Ambulatory referral to Obstetrics / Gynecology  3. Immunization due - Tdap vaccine greater than or equal to 7yo IM  4. Morbid obesity (HCC) Will obtain fasting lab work to rule out comorbidities of obesity such as hyperlipidemia, diabetes, and/or possible thyroid disorder. - Comprehensive metabolic panel; Future - Lipid panel; Future - TSH; Future - Hemoglobin A1c; Future   5. Hypochromic microcytic anemia Will obtain CBC, iron, B12 and vitamin D within the next week with upcoming fasting lab work to assess supplementation of iron currently.  Management of patient's  menstrual cycles will likely improve and patient has been referred to OB/GYN to discuss IUD per patient request.  - CBC with Differential/Platelet; Future - Iron, TIBC and Ferritin Panel; Future - Vitamin B12; Future - VITAMIN D 25 Hydroxy (Vit-D Deficiency, Fractures); Future  Patient to reach out to office if new, worrisome, or unresolved symptoms arise or if no improvement in patient's condition. Patient verbalized understanding and is agreeable to treatment plan. All questions answered to patient's satisfaction.    Return in about 6 weeks (around 03/25/2023) for ANNUAL PHYSICAL and Pap if needed .    Hilbert Bible, [place]

## 2023-03-18 ENCOUNTER — Other Ambulatory Visit (HOSPITAL_BASED_OUTPATIENT_CLINIC_OR_DEPARTMENT_OTHER): Payer: 59

## 2023-03-18 DIAGNOSIS — D509 Iron deficiency anemia, unspecified: Secondary | ICD-10-CM | POA: Diagnosis not present

## 2023-03-19 LAB — COMPREHENSIVE METABOLIC PANEL
ALT: 12 [IU]/L (ref 0–32)
AST: 15 [IU]/L (ref 0–40)
Albumin: 4 g/dL (ref 3.9–4.9)
Alkaline Phosphatase: 80 [IU]/L (ref 44–121)
BUN/Creatinine Ratio: 13 (ref 9–23)
BUN: 9 mg/dL (ref 6–20)
Bilirubin Total: <0.2 mg/dL (ref 0.0–1.2)
CO2: 18 mmol/L — ABNORMAL LOW (ref 20–29)
Calcium: 9 mg/dL (ref 8.7–10.2)
Chloride: 103 mmol/L (ref 96–106)
Creatinine, Ser: 0.71 mg/dL (ref 0.57–1.00)
Globulin, Total: 3 g/dL (ref 1.5–4.5)
Glucose: 88 mg/dL (ref 70–99)
Potassium: 4.3 mmol/L (ref 3.5–5.2)
Sodium: 140 mmol/L (ref 134–144)
Total Protein: 7 g/dL (ref 6.0–8.5)
eGFR: 114 mL/min/{1.73_m2} (ref >59)

## 2023-03-19 LAB — IRON,TIBC AND FERRITIN PANEL
Ferritin: 11 ng/mL — ABNORMAL LOW (ref 15–150)
Iron Saturation: 4 % — CL (ref 15–55)
Iron: 15 ug/dL — ABNORMAL LOW (ref 27–159)
Total Iron Binding Capacity: 364 ug/dL (ref 250–450)
UIBC: 349 ug/dL (ref 131–425)

## 2023-03-19 LAB — CBC WITH DIFFERENTIAL/PLATELET
Basophils Absolute: 0.1 10*3/uL (ref 0.0–0.2)
Basos: 1 %
EOS (ABSOLUTE): 0.4 10*3/uL (ref 0.0–0.4)
Eos: 7 %
Hematocrit: 28.3 % — ABNORMAL LOW (ref 34.0–46.6)
Hemoglobin: 8.1 g/dL — ABNORMAL LOW (ref 11.1–15.9)
Immature Grans (Abs): 0 10*3/uL (ref 0.0–0.1)
Immature Granulocytes: 0 %
Lymphocytes Absolute: 2 10*3/uL (ref 0.7–3.1)
Lymphs: 38 %
MCH: 20.1 pg — ABNORMAL LOW (ref 26.6–33.0)
MCHC: 28.6 g/dL — ABNORMAL LOW (ref 31.5–35.7)
MCV: 70 fL — ABNORMAL LOW (ref 79–97)
Monocytes Absolute: 0.5 10*3/uL (ref 0.1–0.9)
Monocytes: 9 %
Neutrophils Absolute: 2.4 10*3/uL (ref 1.4–7.0)
Neutrophils: 45 %
Platelets: 484 10*3/uL — ABNORMAL HIGH (ref 150–450)
RBC: 4.03 x10E6/uL (ref 3.77–5.28)
RDW: 17.6 % — ABNORMAL HIGH (ref 11.7–15.4)
WBC: 5.3 10*3/uL (ref 3.4–10.8)

## 2023-03-19 LAB — VITAMIN D 25 HYDROXY (VIT D DEFICIENCY, FRACTURES): Vit D, 25-Hydroxy: 11.3 ng/mL — ABNORMAL LOW (ref 30.0–100.0)

## 2023-03-19 LAB — LIPID PANEL
Chol/HDL Ratio: 3.4 {ratio} (ref 0.0–4.4)
Cholesterol, Total: 148 mg/dL (ref 100–199)
HDL: 43 mg/dL (ref >39)
LDL Chol Calc (NIH): 90 mg/dL (ref 0–99)
Triglycerides: 75 mg/dL (ref 0–149)
VLDL Cholesterol Cal: 15 mg/dL (ref 5–40)

## 2023-03-19 LAB — TSH: TSH: 2.05 u[IU]/mL (ref 0.450–4.500)

## 2023-03-19 LAB — VITAMIN B12: Vitamin B-12: 653 pg/mL (ref 232–1245)

## 2023-03-19 LAB — HEMOGLOBIN A1C
Est. average glucose Bld gHb Est-mCnc: 126 mg/dL
Hgb A1c MFr Bld: 6 % — ABNORMAL HIGH (ref 4.8–5.6)

## 2023-03-23 ENCOUNTER — Other Ambulatory Visit (HOSPITAL_BASED_OUTPATIENT_CLINIC_OR_DEPARTMENT_OTHER): Payer: Self-pay | Admitting: Family Medicine

## 2023-03-23 ENCOUNTER — Encounter (HOSPITAL_BASED_OUTPATIENT_CLINIC_OR_DEPARTMENT_OTHER): Payer: Self-pay | Admitting: Family Medicine

## 2023-03-23 MED ORDER — FERROUS SULFATE 325 (65 FE) MG PO TABS: 325.0000 mg | ORAL_TABLET | ORAL | 5 refills | Status: DC

## 2023-03-23 NOTE — Progress Notes (Signed)
Hi Cheryl [name], Your iron saturation and blood counts are still very low.  Please make sure you are taking your iron every day.  We will discuss this further at your appointment this week, but have you heard from OB/GYN regarding a new patient appointment?  Your blood loss due to heavy periods likely will not improve without placement of IUD or starting oral birth control.  We will need to recheck your hemoglobin and hematocrit within the next 1 to 2 weeks to make sure is not getting too low and possibly requiring a transfusion of iron or packed red blood cells.  Your B12, thyroid and cholesterol were normal.  Your A1c is slightly in the prediabetic range and your vitamin D was significantly low as well.  I will discuss these further at your appointment on Thursday.  Please make sure you are taking your iron daily.  Taking the iron with orange juice or vitamin C can also help absorption.  Increasing with an iron rich diet with the following foods can also help as well.  IRON RICH FOODS:  Beef, pork, chicken, [place], scallops, shrimp, salmon, eggs, canned chunk like tuna, grains, fish eggs.  Peanut butter, beans (black, kidney, lima, pinto), lentils, tofu, hummus, dark leafy green vegetables (current greens, kale, broccoli), oatmeal.  Iron enriched breads, pastas, and cereals.

## 2023-03-25 ENCOUNTER — Encounter (HOSPITAL_BASED_OUTPATIENT_CLINIC_OR_DEPARTMENT_OTHER): Payer: Self-pay | Admitting: Family Medicine

## 2023-03-25 ENCOUNTER — Other Ambulatory Visit (HOSPITAL_COMMUNITY)
Admission: RE | Admit: 2023-03-25 | Discharge: 2023-03-25 | Disposition: A | Discharge status: Home / Self Care | Payer: 59 | Source: Ambulatory Visit | Attending: Family Medicine | Admitting: Family Medicine

## 2023-03-25 ENCOUNTER — Ambulatory Visit (INDEPENDENT_AMBULATORY_CARE_PROVIDER_SITE_OTHER): Payer: 59 | Admitting: Family Medicine

## 2023-03-25 VITALS — BP 142/83 | HR 84 | Ht 63.0 in | Wt 345.0 lb

## 2023-03-25 DIAGNOSIS — R030 Elevated blood-pressure reading, without diagnosis of hypertension: Secondary | ICD-10-CM

## 2023-03-25 DIAGNOSIS — E559 Vitamin D deficiency, unspecified: Secondary | ICD-10-CM

## 2023-03-25 DIAGNOSIS — N926 Irregular menstruation, unspecified: Secondary | ICD-10-CM

## 2023-03-25 DIAGNOSIS — D500 Iron deficiency anemia secondary to blood loss (chronic): Secondary | ICD-10-CM | POA: Diagnosis not present

## 2023-03-25 DIAGNOSIS — Z0000 Encounter for general adult medical examination without abnormal findings: Secondary | ICD-10-CM | POA: Diagnosis not present

## 2023-03-25 DIAGNOSIS — Z124 Encounter for screening for malignant neoplasm of cervix: Secondary | ICD-10-CM | POA: Diagnosis not present

## 2023-03-25 DIAGNOSIS — R7303 Prediabetes: Secondary | ICD-10-CM | POA: Diagnosis not present

## 2023-03-25 LAB — HEMOGLOBIN AND HEMATOCRIT, BLOOD
Hematocrit: 27.9 % — ABNORMAL LOW (ref 34.0–46.6)
Hemoglobin: 8 g/dL — ABNORMAL LOW (ref 11.1–15.9)

## 2023-03-25 MED ORDER — CHOLECALCIFEROL 1.25 MG (50000 UT) PO CAPS
50000.0000 [IU] | ORAL_CAPSULE | ORAL | 1 refills | Status: DC
Start: 2023-03-25 — End: 2023-10-23

## 2023-03-25 NOTE — Progress Notes (Signed)
Subjective:   Cheryl A Camino [birth year]  03/25/2023   CC: Chief Complaint  Patient presents with   Annual Exam    Patient is here today for her physical and pap smear. Denies any concerns for today's visit.    HPI: Cheryl [name] is a 33 y.o. female who presents for a routine health maintenance exam.  Labs collected at time of visit.    HEALTH SCREENINGS: - Vision Screening: not applicable - Dental Visits:  She is finding a new one - Pap smear: pap done - Breast Exam:  Completed today - STD Screening: Ordered today - Mammogram (40+): Not applicable  - Colonoscopy (45+): Not applicable  - Bone Density (65+ or under 65 with predisposing conditions): Not applicable  - Lung CA screening with low-dose CT:  Not applicable Adults age 1-80 who are current cigarette smokers or quit within the last 15 years. Must have 20 pack year history.   Depression and Anxiety Screen done today and results listed below:     03/25/2023    9:32 AM 02/11/2023    4:03 PM  Depression screen PHQ 2/9  Decreased Interest 1 1  Down, Depressed, Hopeless 0 2  PHQ - 2 Score 1 3  Altered sleeping 0 0  Tired, decreased energy 2 1  Change in appetite 3 3  Feeling bad or failure about yourself  1 1  Trouble concentrating 3 1  Moving slowly or fidgety/restless 0 0  Suicidal thoughts 0 0  PHQ-9 Score 10 9  Difficult doing work/chores Somewhat difficult Somewhat difficult      03/25/2023    9:34 AM 02/11/2023    4:03 PM  GAD 7 : Generalized Anxiety Score  Nervous, Anxious, on Edge 3 3  Control/stop worrying 3 3  Worry too much - different things 3 3  Trouble relaxing 1 0  Restless 0 2  Easily annoyed or irritable 0 0  Afraid - awful might happen 1 3  Total GAD 7 Score 11 14  Anxiety Difficulty Somewhat difficult     IMMUNIZATIONS: - Tdap: Tetanus vaccination status reviewed: last tetanus booster within 10 years. - HPV: Not applicable - Influenza: Refused - Pneumovax: Not applicable -  Prevnar 20: Not applicable - Shingrix (50+): Not applicable   Past medical history, surgical history, medications, allergies, family history and social history reviewed with patient today and changes made to appropriate areas of the chart.   Past Medical History:  Diagnosis Date   Allergy 2018   Anemia    Anxiety    Depression    Gall stones     History reviewed. No pertinent surgical history.  Current Outpatient Medications on File Prior to Visit  Medication Sig   acetaminophen (TYLENOL) 500 MG tablet Take 1,000 mg by mouth 2 (two) times daily as needed for moderate pain (pain score 4-6) or headache.   ferrous sulfate 325 (65 FE) MG tablet Take 1 tablet (325 mg total) by mouth every other day.   ibuprofen (ADVIL) 200 MG tablet Take 400 mg by mouth 2 (two) times daily as needed for moderate pain (pain score 4-6) or headache.   No current facility-administered medications on file prior to visit.    Allergies  Allergen Reactions   Shellfish Allergy Shortness Of Breath   Latex Itching and Rash    Powder inside the glove     Social History   Socioeconomic History   Marital status: Single    Spouse name: Not on file  Number of children: Not on file   Years of education: Not on file   Highest education level: Associate degree: occupational, technical, or vocational program  Occupational History   Not on file  Tobacco Use   Smoking status: Never   Smokeless tobacco: Never  Vaping Use   Vaping status: Never Used  Substance and Sexual Activity   Alcohol use: No   Drug use: No   Sexual activity: Yes    Birth control/protection: Condom  Other Topics Concern   Not on file  Social History Narrative   Not on file   Social Drivers of Health   Financial Resource Strain: Medium Risk (03/22/2023)   Overall Financial Resource Strain (CARDIA)    Difficulty of Paying Living Expenses: Somewhat hard  Food Insecurity: Food Insecurity Present (03/22/2023)   Hunger Vital Sign     Worried About Running Out of Food in the Last Year: Never true    Ran Out of Food in the Last Year: Sometimes true  Transportation Needs: Unmet Transportation Needs (03/22/2023)   PRAPARE - Transportation    Lack of Transportation (Medical): Yes    Lack of Transportation (Non-Medical): Yes  Physical Activity: Insufficiently Active (03/22/2023)   Exercise Vital Sign    Days of Exercise per Week: 4 days    Minutes of Exercise per Session: 30 min  Stress: Stress Concern Present (03/22/2023)   [employer] of Occupational Health - Occupational Stress Questionnaire    Feeling of Stress : To some extent  Social Connections: Socially Isolated (03/22/2023)   Social Connection and Isolation Panel [NHANES]    Frequency of Communication with Friends and Family: Never    Frequency of Social Gatherings with Friends and Family: Never    Attends Religious Services: Never    Database administrator or Organizations: No    Attends Engineer, structural: Not on file    Marital Status: Never married  Intimate Partner Violence: Not At Risk (03/25/2023)   Humiliation, Afraid, Rape, and Kick questionnaire    Fear of Current or Ex-Partner: No    Emotionally Abused: No    Physically Abused: No    Sexually Abused: No   Social History   Tobacco Use  Smoking Status Never  Smokeless Tobacco Never   Social History   Substance and Sexual Activity  Alcohol Use No    Family History  Problem Relation Age of Onset   Depression Mother    Diabetes Mother    Obesity Mother    Arthritis Mother    Diabetes Maternal Grandmother    Heart disease Maternal Grandmother    Obesity Maternal Grandmother    Arthritis Maternal Grandmother    Stroke Maternal Grandfather      ROS: Denies fever, fatigue, unexplained weight loss/gain, chest pain, SHOB, and palpitations. Denies neurological deficits, gastrointestinal or genitourinary complaints, and skin changes.   Objective:   Today's Vitals   03/25/23  0922 03/25/23 1024  BP: (!) 139/97 (!) 142/83  Pulse: 84   SpO2: 99%   Weight: (!) 345 lb (156.5 kg)   Height: 5\' 3"  (1.6 m)     GENERAL APPEARANCE: Well-appearing, in NAD. Well nourished.  SKIN: Pink, warm and dry. Turgor normal. No rash, lesion, ulceration, or ecchymoses. Hair evenly distributed.  HEENT: HEAD: Normocephalic.  EYES: PERRLA. EOMI. Lids intact w/o defect. Sclera white, Conjunctiva pink w/o exudate.  EARS: External ear w/o redness, swelling, masses or lesions. EAC clear. TM's intact, translucent w/o bulging, appropriate landmarks visualized. Appropriate  acuity to conversational tones.  NOSE: Septum midline w/o deformity. Nares patent, mucosa pink and non-inflamed w/o drainage. No sinus tenderness.  THROAT: Uvula midline. Oropharynx clear. Tonsils non-inflamed w/o exudate. Oral mucosa pink and moist.  NECK: Supple, Trachea midline. Full ROM w/o pain or tenderness. No lymphadenopathy. Thyroid non-tender w/o enlargement or palpable masses.  BREASTS: Breasts pendulous, symmetrical, and w/o palpable masses. Nipples everted and w/o discharge. No rash or skin retraction. No axillary or supraclavicular lymphadenopathy.  RESPIRATORY: Chest wall symmetrical w/o masses. Respirations even and non-labored. Breath sounds clear to auscultation bilaterally. No wheezes, rales, rhonchi, or crackles. CARDIAC: S1, S2 present, regular rate and rhythm. No gallops, murmurs, rubs, or clicks. PMI w/o lifts, heaves, or thrills. No carotid bruits. Capillary refill <2 seconds. Peripheral pulses 2+ bilaterally. GI: Abdomen soft w/o distention. Normoactive bowel sounds. No palpable masses or tenderness. No guarding or rebound tenderness. Liver and spleen w/o tenderness or enlargement. No CVA tenderness.  GU: External genitalia without erythema, lesions, or masses. No lymphadenopathy. Vaginal mucosa pink and moist without exudate, lesions, or ulcerations. Cervix pink without discharge. Cervical os closed.  Uterus and adnexae palpable, not enlarged, and w/o tenderness. No palpable masses.  MSK: Muscle tone and strength appropriate for age, w/o atrophy or abnormal movement.  EXTREMITIES: Active ROM intact, w/o tenderness, crepitus, or contracture. No obvious joint deformities or effusions. No clubbing, edema, or cyanosis.  NEUROLOGIC: CN's II-XII intact. Motor strength symmetrical with no obvious weakness. No sensory deficits. DTR's 2+ symmetric bilaterally. Steady, even gait.  PSYCH/MENTAL STATUS: Alert, oriented x 3. Cooperative, appropriate mood and affect.   Chaperoned by Cristy Hilts, CMA   Results for orders placed or performed in visit on 03/18/23  Hemoglobin A1c   Collection Time: 03/18/23  9:53 AM  Result Value Ref Range   Hgb A1c MFr Bld 6.0 (H) 4.8 - 5.6 %   Est. average glucose Bld gHb Est-mCnc 126 mg/dL  VITAMIN D 25 Hydroxy (Vit-D Deficiency, Fractures)   Collection Time: 03/18/23  9:53 AM  Result Value Ref Range   Vit D, 25-Hydroxy 11.3 (L) 30.0 - 100.0 ng/mL  Vitamin B12   Collection Time: 03/18/23  9:53 AM  Result Value Ref Range   Vitamin B-12 653 232 - 1,245 pg/mL  Iron, TIBC and Ferritin Panel   Collection Time: 03/18/23  9:53 AM  Result Value Ref Range   Total Iron Binding Capacity 364 250 - 450 ug/dL   UIBC 161 096 - 045 ug/dL   Iron 15 (L) 27 - 409 ug/dL   Iron Saturation 4 (LL) 15 - 55 %   Ferritin 11 (L) 15 - 150 ng/mL  TSH   Collection Time: 03/18/23  9:53 AM  Result Value Ref Range   TSH 2.050 0.450 - 4.500 uIU/mL  Lipid panel   Collection Time: 03/18/23  9:53 AM  Result Value Ref Range   Cholesterol, Total 148 100 - 199 mg/dL   Triglycerides 75 0 - 149 mg/dL   HDL 43 >81 mg/dL   VLDL Cholesterol Cal 15 5 - 40 mg/dL   LDL Chol Calc (NIH) 90 0 - 99 mg/dL   Chol/HDL Ratio 3.4 0.0 - 4.4 ratio  Comprehensive metabolic panel   Collection Time: 03/18/23  9:53 AM  Result Value Ref Range   Glucose 88 70 - 99 mg/dL   BUN 9 6 - 20 mg/dL   Creatinine, Ser  1.91 0.57 - 1.00 mg/dL   eGFR 478 >29 FA/OZH/0.86   BUN/Creatinine Ratio 13 9 -  23   Sodium 140 134 - 144 mmol/L   Potassium 4.3 3.5 - 5.2 mmol/L   Chloride 103 96 - 106 mmol/L   CO2 18 (L) 20 - 29 mmol/L   Calcium 9.0 8.7 - 10.2 mg/dL   Total Protein 7.0 6.0 - 8.5 g/dL   Albumin 4.0 3.9 - 4.9 g/dL   Globulin, Total 3.0 1.5 - 4.5 g/dL   Bilirubin Total <2.1 0.0 - 1.2 mg/dL   Alkaline Phosphatase 80 44 - 121 IU/L   AST 15 0 - 40 IU/L   ALT 12 0 - 32 IU/L  CBC with Differential/Platelet   Collection Time: 03/18/23  9:53 AM  Result Value Ref Range   WBC 5.3 3.4 - 10.8 x10E3/uL   RBC 4.03 3.77 - 5.28 x10E6/uL   Hemoglobin 8.1 (L) 11.1 - 15.9 g/dL   Hematocrit 30.8 (L) 65.7 - 46.6 %   MCV 70 (L) 79 - 97 fL   MCH 20.1 (L) 26.6 - 33.0 pg   MCHC 28.6 (L) 31.5 - 35.7 g/dL   RDW 84.6 (H) 96.2 - 95.2 %   Platelets 484 (H) 150 - 450 x10E3/uL   Neutrophils 45 Not Estab. %   Lymphs 38 Not Estab. %   Monocytes 9 Not Estab. %   Eos 7 Not Estab. %   Basos 1 Not Estab. %   Neutrophils Absolute 2.4 1.4 - 7.0 x10E3/uL   Lymphocytes Absolute 2.0 0.7 - 3.1 x10E3/uL   Monocytes Absolute 0.5 0.1 - 0.9 x10E3/uL   EOS (ABSOLUTE) 0.4 0.0 - 0.4 x10E3/uL   Basophils Absolute 0.1 0.0 - 0.2 x10E3/uL   Immature Granulocytes 0 Not Estab. %   Immature Grans (Abs) 0.0 0.0 - 0.1 x10E3/uL    Assessment & Plan:  1. Annual physical exam (Primary) Discussed patient's lab results in depth, preventative screenings and vaccinations. We discussed healthy weight, lifestyle, and diet, exercise changes that patient has been implementing.   2. Encounter for Papanicolaou smear for cervical cancer screening Pap completed in office with STD testing for GC, chlamydia and trich per patient request. Declined Hep C and HIV testing. Will notify of results when available.  - Cytology - PAP  3. IRREGULAR MENSTRUAL CYCLE Pt requesting IUD placement for improvement of menstrual cycle and anemia due to chronic blood loss. PCP  previously placed referral to OBGYN, but referral was closed due to 3 attempts and VM box full. New referral placed, pt will call to schedule if not contacted.  - Ambulatory referral to Obstetrics / Gynecology  4. Iron deficiency anemia due to chronic blood loss Due to chronic blood loss with menorrhagia. Will repeat H&H today due to no improvement after 2 months of PO iron replacement. Referral to OBGYN placed for IUD placement to improve menstrual cycle.  - Hemoglobin and hematocrit, blood - Ambulatory referral to Obstetrics / Gynecology  5. Prediabetes A1C 6.0. Discussed initial trial of diet, exercise, and monitoring carboyhdates versus Metformin. Pt would like to try lifestyle changes first and repeat in 4-6 months.   6. Vitamin D deficiency Start Cholecalciferol 50,000 units weekly x 3 months and repeat. Warning present due to shellfish allergy with medication. Pt denies shellfish allergy currently and agreeable to proceed with supplement.   7. Elevated BP reading BP normal at last visit. Will return in 3-4 months for BP recheck and continue lifestyle changes. Recommend patient monitor BP regularly and return sooner if consistently elevated.   Orders Placed This Encounter  Procedures   Hemoglobin and  hematocrit, blood   Ambulatory referral to Obstetrics / Gynecology    Referral Priority:   Urgent    Referral Type:   Consultation    Referral Reason:   Specialty Services Required    Requested Specialty:   Obstetrics and Gynecology    Number of Visits Requested:   1    PATIENT COUNSELING:  - Encouraged a healthy well-balanced diet. Patient may adjust caloric intake to maintain or achieve ideal body weight. May reduce intake of dietary saturated fat and total fat and have adequate dietary potassium and calcium preferably from fresh fruits, vegetables, and low-fat dairy products.   - Advised to avoid cigarette smoking. - Discussed with the patient that most people either abstain from  alcohol or drink within safe limits (<=14/week and <=4 drinks/occasion for males, <=7/weeks and <= 3 drinks/occasion for females) and that the risk for alcohol disorders and other health effects rises proportionally with the number of drinks per week and how often a drinker exceeds daily limits. - Discussed cessation/primary prevention of drug use and availability of treatment for abuse.  - Discussed sexually transmitted diseases, avoidance of unintended pregnancy and contraceptive alternatives.  - Stressed the importance of regular exercise - Injury prevention: Discussed safety belts, safety helmets, smoke detector, smoking near bedding or upholstery.  - Dental health: Discussed importance of regular tooth brushing, flossing, and dental visits.   NEXT PREVENTATIVE PHYSICAL DUE IN 1 YEAR.  Return in about 4 months (around 07/23/2023) for Follow up Vitamin D and Iron Deficiency Anemia .  Patient to reach out to office if new, worrisome, or unresolved symptoms arise or if no improvement in patient's condition. Patient verbalized understanding and is agreeable to treatment plan. All questions answered to patient's satisfaction.    Hilbert Bible, [place]

## 2023-03-25 NOTE — Patient Instructions (Addendum)
Please schedule with OBGYN at check out.    IRON RICH FOODS:  Beef, pork, chicken, [place], scallops, shrimp, salmon, eggs, canned chunk like tuna, grains, fish eggs.  Peanut butter, beans (black, kidney, lima, pinto), lentils, tofu, hummus, dark leafy green vegetables (current greens, kale, broccoli), oatmeal.  Iron enriched breads, pastas, and cereals.

## 2023-03-26 ENCOUNTER — Telehealth (HOSPITAL_BASED_OUTPATIENT_CLINIC_OR_DEPARTMENT_OTHER): Payer: Self-pay | Admitting: Certified Nurse Midwife

## 2023-03-26 ENCOUNTER — Other Ambulatory Visit (HOSPITAL_BASED_OUTPATIENT_CLINIC_OR_DEPARTMENT_OTHER): Payer: Self-pay | Admitting: Family Medicine

## 2023-03-26 ENCOUNTER — Encounter (HOSPITAL_BASED_OUTPATIENT_CLINIC_OR_DEPARTMENT_OTHER): Payer: Self-pay | Admitting: Family Medicine

## 2023-03-26 DIAGNOSIS — D500 Iron deficiency anemia secondary to blood loss (chronic): Secondary | ICD-10-CM

## 2023-03-26 NOTE — Progress Notes (Signed)
Hi Lupita Leash,  This patient is needing IUD placement as choice of birth control to help w/ anemia related to chronic blood loss. As you can see, her iron is not improving with daily iron replacement. Would you be able to assist in getting her scheduled with your office? I placed a referral prior and another one on 03/25/23 as the first was closed due to VM full. I would like to avoid her needing a transfusion.   Irving Burton - can you schedule for lab repeat in 2 weeks?

## 2023-03-26 NOTE — Telephone Encounter (Signed)
I tried to call patient to schedule Mirena IUD Insertion but "Mailbox is full". I was not able to leave her a message. Irving Burton, if you send a letter will you give her our telephone and ask her to call us to schedule Mirena IUD insertion please?  Thanks!

## 2023-03-29 ENCOUNTER — Encounter (HOSPITAL_BASED_OUTPATIENT_CLINIC_OR_DEPARTMENT_OTHER): Payer: Self-pay | Admitting: *Deleted

## 2023-03-30 ENCOUNTER — Telehealth (HOSPITAL_BASED_OUTPATIENT_CLINIC_OR_DEPARTMENT_OTHER): Payer: Self-pay | Admitting: *Deleted

## 2023-03-30 ENCOUNTER — Encounter (HOSPITAL_BASED_OUTPATIENT_CLINIC_OR_DEPARTMENT_OTHER): Payer: Self-pay | Admitting: Family Medicine

## 2023-03-30 LAB — CYTOLOGY - PAP
Chlamydia: NEGATIVE
Comment: NEGATIVE
Comment: NEGATIVE
Comment: NEGATIVE
Comment: NORMAL
Diagnosis: NEGATIVE
High risk HPV: NEGATIVE
Neisseria Gonorrhea: NEGATIVE
Trichomonas: NEGATIVE

## 2023-03-30 NOTE — Progress Notes (Signed)
Pap Smear is normal. Health maintenance updated. Repeat pap in 3 years.

## 2023-03-30 NOTE — Telephone Encounter (Signed)
Copied from CRM [id number]. Topic: Clinical - Lab/Test Results >> Mar 30, 2023  1:06 PM Antony Haste wrote: Reason for CRM: Patient is requesting to review lab results, relayed her results verbatim. She stated for the OBGYN scheudling, she was informed that they aren't accepting new patients so she is unsure what to do.

## 2023-03-30 NOTE — Telephone Encounter (Signed)
Attempted to call pt but unable to reach. Unable to leave VM as mailbox was full. Will try to call back later.

## 2023-04-05 NOTE — Telephone Encounter (Signed)
Attempted to call pt but unable to reach and unable to leave VM as mailbox was full. Due to multiple attempts trying to call pt without being able to reach, will close encounter.

## 2023-04-07 ENCOUNTER — Telehealth (HOSPITAL_BASED_OUTPATIENT_CLINIC_OR_DEPARTMENT_OTHER): Payer: Self-pay | Admitting: *Deleted

## 2023-04-07 NOTE — Telephone Encounter (Signed)
 Attempted to call pt but unable to reach. Left message for her to return call letting her know that we are needing her to have repeated labs.

## 2023-04-07 NOTE — Telephone Encounter (Signed)
-----   Message from Thersia Bitters Caudle sent at 04/07/2023  2:34 PM EST ----- Can we please call patient to have her come in for repeat H&H with lab? ----- Message ----- From: Knute Thersia Bitters, FNP Sent: 04/07/2023  12:00 AM EST To: Thersia Bitters Knute, FNP  Pt needs repeat of H&H

## 2023-04-08 ENCOUNTER — Other Ambulatory Visit (HOSPITAL_BASED_OUTPATIENT_CLINIC_OR_DEPARTMENT_OTHER): Payer: Self-pay | Admitting: Certified Nurse Midwife

## 2023-05-19 ENCOUNTER — Encounter (HOSPITAL_BASED_OUTPATIENT_CLINIC_OR_DEPARTMENT_OTHER): Payer: Self-pay | Admitting: *Deleted

## 2023-07-23 ENCOUNTER — Encounter (HOSPITAL_BASED_OUTPATIENT_CLINIC_OR_DEPARTMENT_OTHER): Payer: Self-pay

## 2023-07-23 ENCOUNTER — Ambulatory Visit (HOSPITAL_BASED_OUTPATIENT_CLINIC_OR_DEPARTMENT_OTHER): Payer: 59 | Admitting: Family Medicine

## 2023-08-02 ENCOUNTER — Encounter (HOSPITAL_BASED_OUTPATIENT_CLINIC_OR_DEPARTMENT_OTHER): Payer: Self-pay | Admitting: Family Medicine

## 2023-08-02 ENCOUNTER — Ambulatory Visit (HOSPITAL_BASED_OUTPATIENT_CLINIC_OR_DEPARTMENT_OTHER): Payer: Self-pay | Admitting: Family Medicine

## 2023-08-02 ENCOUNTER — Ambulatory Visit (INDEPENDENT_AMBULATORY_CARE_PROVIDER_SITE_OTHER): Admitting: Family Medicine

## 2023-08-02 VITALS — BP 131/93 | HR 84 | Ht 63.0 in | Wt 348.8 lb

## 2023-08-02 DIAGNOSIS — Z0120 Encounter for dental examination and cleaning without abnormal findings: Secondary | ICD-10-CM | POA: Diagnosis not present

## 2023-08-02 DIAGNOSIS — R1011 Right upper quadrant pain: Secondary | ICD-10-CM | POA: Diagnosis not present

## 2023-08-02 LAB — POCT URINALYSIS DIP (CLINITEK)
Bilirubin, UA: NEGATIVE
Blood, UA: NEGATIVE
Glucose, UA: NEGATIVE mg/dL
Ketones, POC UA: NEGATIVE mg/dL
Nitrite, UA: NEGATIVE
POC PROTEIN,UA: NEGATIVE
Spec Grav, UA: >=1.03 — AB (ref 1.010–1.025)
Urobilinogen, UA: 0.2 U/dL
pH, UA: 5.5 (ref 5.0–8.0)

## 2023-08-02 LAB — POCT URINE PREGNANCY: Preg Test, Ur: NEGATIVE

## 2023-08-02 NOTE — Progress Notes (Signed)
 Subjective:   Cheryl [name] 02-10-1991 08/02/2023  Chief Complaint  Patient presents with   Medical Management of Chronic Issues    Patient state she had pain from gallbladder two days ago. Also has been having a toothache so needs a list of dental providers.    HPI: ABDOMINAL PAIN: Onset: 5 days ago.  Location:  RUQ and radiating the upper right back. Description of pain: Reports pain worsens at times with eating and also when she does fast. Reports hx of gallstones approx. 5 years ago and recommend surgical intervention. She reports nausea, no vomiting.  Radiation: Right upper back  Alleviating factors: Repositioning, Tylenol , Ibuprofen  Aggravating factors: Eating, movements    Fever: no Nausea: yes Vomiting: no Weight loss: no Decreased appetite: no Diarrhea: no Constipation: yes Blood in stool: no Heartburn: no Dysuria/urinary frequency: no Hematuria: no History of sexually transmitted disease: no Recurrent NSAID use: no Patient's last menstrual period was 07/05/2023 (approximate). Patient states she usually starts the first week of the month but is approx. 2 days late.     DENTAL:  Patient requesting a list of dental providers due to toothache intermittently to the right lower molar space with missing /cracked tooth. Denies difficulty chewing, fever, drainage or swelling.      The following portions of the patient's history were reviewed and updated as appropriate: past medical history, past surgical history, family history, social history, allergies, medications, and problem list.   Patient Active Problem List   Diagnosis Date Noted   Prediabetes 03/25/2023   Iron deficiency anemia due to chronic blood loss 03/25/2023   Vitamin D  deficiency 03/25/2023   Elevated blood pressure reading 03/25/2023   Biliary colic    OBESITY 01/02/2008   IRREGULAR MENSTRUAL CYCLE 01/02/2008   Past Medical History:  Diagnosis Date   Allergy 2018   Anemia     Anxiety    Depression    Gall stones    History reviewed. No pertinent surgical history. Family History  Problem Relation Age of Onset   Depression Mother    Diabetes Mother    Obesity Mother    Arthritis Mother    Diabetes Maternal Grandmother    Heart disease Maternal Grandmother    Obesity Maternal Grandmother    Arthritis Maternal Grandmother    Stroke Maternal Grandfather    Outpatient Medications Prior to Visit  Medication Sig Dispense Refill   acetaminophen  (TYLENOL ) 500 MG tablet Take 1,000 mg by mouth 2 (two) times daily as needed for moderate pain (pain score 4-6) or headache.     Cholecalciferol  1.25 MG (50000 UT) capsule Take 1 capsule (50,000 Units total) by mouth once a week. 12 capsule 1   ferrous sulfate  325 (65 FE) MG tablet Take 1 tablet (325 mg total) by mouth every other day. 30 tablet 5   ibuprofen (ADVIL) 200 MG tablet Take 400 mg by mouth 2 (two) times daily as needed for moderate pain (pain score 4-6) or headache.     No facility-administered medications prior to visit.   Allergies  Allergen Reactions   Shellfish Allergy Shortness Of Breath   Latex Itching and Rash    Powder inside the glove     ROS: A complete ROS was performed with pertinent positives/negatives noted in the HPI. The remainder of the ROS are negative.    Objective:   Today's Vitals   08/02/23 0921 08/02/23 1039  BP: (!) 122/91 (!) 131/93  Pulse: 84   SpO2: 96%  Weight: (!) 348 lb 12.8 oz (158.2 kg)   Height: 5\' 3"  (1.6 m)     Physical Exam          GENERAL: Well-appearing, in NAD. Well nourished.  SKIN: Pink, warm and dry. No rash, lesion, ulceration, or ecchymoses.  Head: Normocephalic. NECK: Trachea midline. Full ROM w/o pain or tenderness. No lymphadenopathy.  EARS: Tympanic membranes are intact, translucent without bulging and without drainage. Appropriate landmarks visualized.  EYES: Conjunctiva clear without exudates. EOMI, PERRL, no drainage present.  NOSE:  Septum midline w/o deformity. Nares patent, mucosa pink and non-inflamed w/o drainage. No sinus tenderness.  THROAT: Uvula midline. Oropharynx clear. Tonsils non-inflamed without exudate. Mucous membranes pink and moist. Chipped tooth to right lower molar. No signs of infection or drainage.  RESPIRATORY: Chest wall symmetrical. Respirations even and non-labored. Breath sounds clear to auscultation bilaterally.  CARDIAC: S1, S2 present, regular rate and rhythm without murmur or gallops. Peripheral pulses 2+ bilaterally.  GI: Abdomen soft, mildly tender to RUQ with palpation. +Murphy sign.  Normoactive bowel sounds. No rebound tenderness. No hepatomegaly or splenomegaly. No CVA tenderness.  MSK: Muscle tone and strength appropriate for age.  NEUROLOGIC: No motor or sensory deficits. Steady, even gait. C2-C12 intact.  PSYCH/MENTAL STATUS: Alert, oriented x 3. Cooperative, appropriate mood and affect.   Health Maintenance Due  Topic Date Due   COVID-19 Vaccine (4 - 2024-25 season) 11/01/2022    Results for orders placed or performed in visit on 08/02/23  POCT URINALYSIS DIP (CLINITEK)  Result Value Ref Range   Color, UA yellow yellow   Clarity, UA clear clear   Glucose, UA negative negative mg/dL   Bilirubin, UA negative negative   Ketones, POC UA negative negative mg/dL   Spec Grav, UA >=7.829 (A) 1.010 - 1.025   Blood, UA negative negative   pH, UA 5.5 5.0 - 8.0   POC PROTEIN,UA negative negative, trace   Urobilinogen, UA 0.2 0.2 or 1.0 E.U./dL   Nitrite, UA Negative Negative   Leukocytes, UA Trace (A) Negative  POCT urine pregnancy  Result Value Ref Range   Preg Test, Ur Negative Negative    The ASCVD Risk score (Arnett DK, et al., 2019) failed to calculate for the following reasons:   The 2019 ASCVD risk score is only valid for ages 64 to 59     Assessment & Plan:   1. Colicky RUQ abdominal pain (Primary) Possible cholelithiasis given hx and presentation. Will obtain stat US   RUQ and labs. Upreg and UA negative in office for signs of infection or hematuria. Discussed red flag signs and symptoms to seek ER while undergoing workup and recommended dietary changes.  - US  Abdomen Limited RUQ (LIVER/GB); Future - Lipase - CBC with Differential/Platelet - Comprehensive metabolic panel with GFR - POCT URINALYSIS DIP (CLINITEK) - POCT urine pregnancy  2. Need for assessment by dentistry for poor dentition List of local dental providers given to patient. No signs of dental abscess or infection at this time.   No orders of the defined types were placed in this encounter.  Lab Orders         Lipase         CBC with Differential/Platelet         Comprehensive metabolic panel with GFR         POCT URINALYSIS DIP (CLINITEK)         POCT urine pregnancy     No images are attached to the encounter or orders  placed in the encounter.  Return if symptoms worsen or fail to improve.    Patient to reach out to office if new, worrisome, or unresolved symptoms arise or if no improvement in patient's condition. Patient verbalized understanding and is agreeable to treatment plan. All questions answered to patient's satisfaction.    Nonda Bays, [place]

## 2023-08-06 ENCOUNTER — Other Ambulatory Visit (HOSPITAL_BASED_OUTPATIENT_CLINIC_OR_DEPARTMENT_OTHER): Payer: Self-pay | Admitting: Family Medicine

## 2023-08-06 ENCOUNTER — Ambulatory Visit (HOSPITAL_BASED_OUTPATIENT_CLINIC_OR_DEPARTMENT_OTHER)
Admission: RE | Admit: 2023-08-06 | Discharge: 2023-08-06 | Disposition: A | Discharge status: Home / Self Care | Source: Ambulatory Visit | Attending: Family Medicine | Admitting: Family Medicine

## 2023-08-06 DIAGNOSIS — R1011 Right upper quadrant pain: Secondary | ICD-10-CM | POA: Insufficient documentation

## 2023-08-06 DIAGNOSIS — K8020 Calculus of gallbladder without cholecystitis without obstruction: Secondary | ICD-10-CM

## 2023-08-06 DIAGNOSIS — K838 Other specified diseases of biliary tract: Secondary | ICD-10-CM | POA: Diagnosis not present

## 2023-08-06 DIAGNOSIS — K8000 Calculus of gallbladder with acute cholecystitis without obstruction: Secondary | ICD-10-CM | POA: Diagnosis not present

## 2023-08-06 DIAGNOSIS — R110 Nausea: Secondary | ICD-10-CM | POA: Diagnosis not present

## 2023-08-06 NOTE — Progress Notes (Signed)
 Hi [place], Your ultrasound does show significant amount of gallstones present in the gallbladder.  It does not show acute cholecystitis, but they have recommended a MRCP study to show if there is any obstruction to the common bile duct because of your stones.  I have placed a urgent referral to [facility] gastroenterology to evaluate and provide the MRCP.  Were you able to get your labs drawn on Monday?  If you are having significant right upper quadrant pain, nausea or vomiting over the weekend, please go to the ER.

## 2023-08-10 ENCOUNTER — Encounter: Payer: Self-pay | Admitting: Gastroenterology

## 2023-09-20 ENCOUNTER — Encounter: Payer: Self-pay | Admitting: Gastroenterology

## 2023-09-20 ENCOUNTER — Other Ambulatory Visit (INDEPENDENT_AMBULATORY_CARE_PROVIDER_SITE_OTHER)

## 2023-09-20 ENCOUNTER — Ambulatory Visit (INDEPENDENT_AMBULATORY_CARE_PROVIDER_SITE_OTHER): Admitting: Gastroenterology

## 2023-09-20 VITALS — BP 118/68 | HR 72 | Ht 63.0 in | Wt 350.0 lb

## 2023-09-20 DIAGNOSIS — K838 Other specified diseases of biliary tract: Secondary | ICD-10-CM

## 2023-09-20 DIAGNOSIS — D509 Iron deficiency anemia, unspecified: Secondary | ICD-10-CM | POA: Diagnosis not present

## 2023-09-20 DIAGNOSIS — R1010 Upper abdominal pain, unspecified: Secondary | ICD-10-CM

## 2023-09-20 DIAGNOSIS — D500 Iron deficiency anemia secondary to blood loss (chronic): Secondary | ICD-10-CM | POA: Diagnosis not present

## 2023-09-20 DIAGNOSIS — R1011 Right upper quadrant pain: Secondary | ICD-10-CM

## 2023-09-20 DIAGNOSIS — K8020 Calculus of gallbladder without cholecystitis without obstruction: Secondary | ICD-10-CM | POA: Diagnosis not present

## 2023-09-20 LAB — COMPREHENSIVE METABOLIC PANEL WITH GFR
ALT: 13 U/L (ref 0–35)
AST: 14 U/L (ref 0–37)
Albumin: 4.1 g/dL (ref 3.5–5.2)
Alkaline Phosphatase: 62 U/L (ref 39–117)
BUN: 11 mg/dL (ref 6–23)
CO2: 28 meq/L (ref 19–32)
Calcium: 9.1 mg/dL (ref 8.4–10.5)
Chloride: 107 meq/L (ref 96–112)
Creatinine, Ser: 0.88 mg/dL (ref 0.40–1.20)
GFR: 85.66 mL/min (ref >60.00)
Glucose, Bld: 87 mg/dL (ref 70–99)
Potassium: 3.8 meq/L (ref 3.5–5.1)
Sodium: 141 meq/L (ref 135–145)
Total Bilirubin: 0.2 mg/dL (ref 0.2–1.2)
Total Protein: 7.6 g/dL (ref 6.0–8.3)

## 2023-09-20 LAB — LIPASE: Lipase: 11 U/L (ref 11.0–59.0)

## 2023-09-20 LAB — CBC WITH DIFFERENTIAL/PLATELET
Basophils Absolute: 0.1 K/uL (ref 0.0–0.1)
Basophils Relative: 1.3 % (ref 0.0–3.0)
Eosinophils Absolute: 0.4 K/uL (ref 0.0–0.7)
Eosinophils Relative: 7.5 % — ABNORMAL HIGH (ref 0.0–5.0)
HCT: 35.1 % — ABNORMAL LOW (ref 36.0–46.0)
Hemoglobin: 11 g/dL — ABNORMAL LOW (ref 12.0–15.0)
Lymphocytes Relative: 42.1 % (ref 12.0–46.0)
Lymphs Abs: 2 K/uL (ref 0.7–4.0)
MCHC: 31.3 g/dL (ref 30.0–36.0)
MCV: 77.9 fl — ABNORMAL LOW (ref 78.0–100.0)
Monocytes Absolute: 0.4 K/uL (ref 0.1–1.0)
Monocytes Relative: 8.3 % (ref 3.0–12.0)
Neutro Abs: 2 K/uL (ref 1.4–7.7)
Neutrophils Relative %: 40.8 % — ABNORMAL LOW (ref 43.0–77.0)
Platelets: 393 K/uL (ref 150.0–400.0)
RBC: 4.5 Mil/uL (ref 3.87–5.11)
RDW: 22.2 % — ABNORMAL HIGH (ref 11.5–15.5)
WBC: 4.8 K/uL (ref 4.0–10.5)

## 2023-09-20 LAB — IBC + FERRITIN
Ferritin: 5.4 ng/mL — ABNORMAL LOW (ref 10.0–291.0)
Iron: 24 ug/dL — ABNORMAL LOW (ref 42–145)
Saturation Ratios: 5.9 % — ABNORMAL LOW (ref 20.0–50.0)
TIBC: 406 ug/dL (ref 250.0–450.0)
Transferrin: 290 mg/dL (ref 212.0–360.0)

## 2023-09-20 NOTE — Patient Instructions (Signed)
 Your provider has requested that you go to the basement level for lab work before leaving today. Press B on the elevator. The lab is located at the first door on the left as you exit the elevator.   You have been scheduled for an MRI at [facility] on 09/23/23. Your appointment time is 3 pm. Please arrive to admitting (at main entrance of the hospital) 30 minutes prior to your appointment time for registration purposes. Please make certain not to have anything to eat or drink 4 hours prior to your test. In addition, if you have any metal in your body, have a pacemaker or defibrillator, please be sure to let your ordering physician know. This test typically takes 45 minutes to 1 hour to complete. Should you need to reschedule, please call [phone] to do so.  _______________________________________________________  If your blood pressure at your visit was 140/90 or greater, please contact your primary care physician to follow up on this.  _______________________________________________________  If you are age 35 or older, your body mass index should be between 23-30. Your Body mass index is 62 kg/m. If this is out of the aforementioned range listed, please consider follow up with your Primary Care Provider.  If you are age 3 or younger, your body mass index should be between 19-25. Your Body mass index is 62 kg/m. If this is out of the aformentioned range listed, please consider follow up with your Primary Care Provider.   ________________________________________________________  The [facility] providers would like to encourage you to use MYCHART to communicate with providers for non-urgent requests or questions.  Due to long hold times on the telephone, sending your provider a message by [facility] may be a faster and more efficient way to get a response.  Please allow 48 business hours for a response.  Please remember that this is for non-urgent requests.   _______________________________________________________

## 2023-09-20 NOTE — Progress Notes (Signed)
 Cheryl A Thal [id number] 1990-08-26    Chief Complaint: Gallstones  Referring Provider: Knute Thersia Bitters, * Primary GI MD: Sampson  HPI: Cheryl [name] is a 33 y.o. female with past medical history of anemia, anxiety/depression, gallstones who presents today for further evaluation of cholelithiasis.  08/02/2023 patient seen by PCP for complaint of RUQ abdominal pain with radiation to right upper back.  Pain worse with eating.  Reported history of gallstones approximately 5 years ago and was advised to have surgical intervention.  RUQ ultrasound on 08/06/2023 showed dilation of the CBD to 10 mm with no intrahepatic biliary ductal dilation or visualized choledocholithiasis.  Decompressed gallbladder was full of stones.  Follow-up MRI was recommended.  Patient referred to GI for further workup.  Labs 03/18/2023: Hemoglobin 8.1, hematocrit 28.3, MCV 70, platelets 484, normal CMP, normal lipid panel, normal TSH, iron 15, iron saturation 4, ferritin 11, normal vitamin B12, vitamin D  11.3   Patient states she has intermittent episodes of sharp/stabbing RUQ pain with radiation to the right of her back.  Sometimes pain begins on the right side of her back.  She can have radiation to her epigastrium and left upper quadrant as well.  Pain can occur after eating or while fasting. Last time this occurred was a couple months ago and states that she had not eaten anything but had to go home due to severe stabbing pain in her right upper abdomen.  Pain can go away quickly or last several hours.  She denies any associated nausea, vomiting, fever, chills, jaundice, pruritus. Denies recurrence of sharp/stabbing pain since episode 2 months ago.  Patient denies dysphagia, acid reflux, heartburn.  She reports history of iron deficiency anemia, has been instructed to take oral iron but often forgets to take it.  States she has a history of irregular periods.  She can go a few months without a period, then can  have a few months of heavy bleeding.  She denies any signs of GI bleeding.  She has regular bowel movements, with some occasional constipation particularly since starting on iron.  Denies diarrhea, blood in her stool, or melena.  Previous GI Procedures/Imaging   RUQ US  08/06/2023 1. The common bile duct is dilated measuring 10 mm. No intrahepatic biliary ductal dilation or visualized choledocholithiasis. Correlation with serum bilirubin recommended. A follow-up multiphase abdominal MRI with IV contrast is also recommended to evaluate for downstream obstruction. 2. Decompressed gallbladder, full of stones. No changes of acute cholecystitis.  RUQ US  04/24/2018 Wall echo shadow complex, most typically seen with a stone filled, contracted gallbladder. No specific evidence of acute inflammation.  Abdominal ultrasound 09/04/2010 Findings compatible with cholelithiasis with associated gallbladder  wall thickening and common bile duct dilatation to 7.7 mm.   Small focal echogenic area in the right lobe of the liver possibly  representing a small hemangioma.  If the patient has no underlying  history of malignancy, short-term follow-up with rescanning in 6  months would be recommended for this finding. If the patient is  high risk, further assessment with MRI with contrast would be  appropriate.    Past Medical History:  Diagnosis Date   Allergy 2018   Anemia    Anxiety    Depression    Gall stones     Current Outpatient Medications  Medication Sig Dispense Refill   acetaminophen  (TYLENOL ) 500 MG tablet Take 1,000 mg by mouth 2 (two) times daily as needed for moderate pain (pain score 4-6) or headache.  Cholecalciferol  1.25 MG (50000 UT) capsule Take 1 capsule (50,000 Units total) by mouth once a week. 12 capsule 1   ferrous sulfate  325 (65 FE) MG tablet Take 1 tablet (325 mg total) by mouth every other day. 30 tablet 5   ibuprofen (ADVIL) 200 MG tablet Take 400 mg by mouth 2 (two)  times daily as needed for moderate pain (pain score 4-6) or headache.     No current facility-administered medications for this visit.    Allergies as of 09/20/2023 - Review Complete 08/02/2023  Allergen Reaction Noted   Shellfish allergy Shortness Of Breath 12/27/2019   Latex Itching and Rash 06/06/2013    Family History  Problem Relation Age of Onset   Depression Mother    Diabetes Mother    Obesity Mother    Arthritis Mother    Diabetes Maternal Grandmother    Heart disease Maternal Grandmother    Obesity Maternal Grandmother    Arthritis Maternal Grandmother    Stroke Maternal Grandfather     Social History   Tobacco Use   Smoking status: Never   Smokeless tobacco: Never  Vaping Use   Vaping status: Never Used  Substance Use Topics   Alcohol use: No   Drug use: No     Review of Systems:    Constitutional: No unexplained weight loss, fever, chills, weakness or fatigue Skin: No rash or itching Cardiovascular: No chest pain, chest pressure or palpitations   Respiratory: No SOB or cough Gastrointestinal: See HPI and otherwise negative Neurological: No headache, dizziness or syncope Musculoskeletal: No new muscle or joint pain Hematologic: No bleeding or bruising    Physical Exam:  Vital signs: BP 118/68   Pulse 72   Ht 5' 3 (1.6 m)   Wt (!) 350 lb (158.8 kg)   BMI 62.00 kg/m    Constitutional: Pleasant, morbidly obese female in NAD, alert and cooperative Head:  Normocephalic and atraumatic.  Eyes: No scleral icterus.  Respiratory: Respirations even and unlabored. Lungs clear to auscultation bilaterally.  No wheezes, crackles, or rhonchi.  Cardiovascular:  Regular rate and rhythm. No murmurs. No peripheral edema. Gastrointestinal:  Soft, nondistended, mild tenderness to palpation of upper abdomen, with increased tenderness in RUQ. No rebound or guarding. Normal bowel sounds. No appreciable masses or hepatomegaly. Rectal:  Not performed.  Neurologic:   Alert and oriented x4;  grossly normal neurologically.  Skin:   Dry and intact without significant lesions or rashes. Psychiatric: Oriented to person, place and time. Demonstrates good judgement and reason without abnormal affect or behaviors.   RELEVANT LABS AND IMAGING: CBC    Component Value Date/Time   WBC 5.3 03/18/2023 0953   WBC 9.2 01/11/2023 1122   RBC 4.03 03/18/2023 0953   RBC 3.59 (L) 01/11/2023 1122   HGB 8.0 (L) 03/25/2023 1020   HCT 27.9 (L) 03/25/2023 1020   PLT 484 (H) 03/18/2023 0953   MCV 70 (L) 03/18/2023 0953   MCH 20.1 (L) 03/18/2023 0953   MCH 22.6 (L) 01/11/2023 1122   MCHC 28.6 (L) 03/18/2023 0953   MCHC 28.5 (L) 01/11/2023 1122   RDW 17.6 (H) 03/18/2023 0953   LYMPHSABS 2.0 03/18/2023 0953   MONOABS 0.7 01/11/2023 1122   EOSABS 0.4 03/18/2023 0953   BASOSABS 0.1 03/18/2023 0953    CMP     Component Value Date/Time   NA 140 03/18/2023 0953   K 4.3 03/18/2023 0953   CL 103 03/18/2023 0953   CO2 18 (L) 03/18/2023 9046  GLUCOSE 88 03/18/2023 0953   GLUCOSE 120 (H) 01/11/2023 1122   BUN 9 03/18/2023 0953   CREATININE 0.71 03/18/2023 0953   CALCIUM 9.0 03/18/2023 0953   PROT 7.0 03/18/2023 0953   ALBUMIN 4.0 03/18/2023 0953   AST 15 03/18/2023 0953   ALT 12 03/18/2023 0953   ALKPHOS 80 03/18/2023 0953   BILITOT <0.2 03/18/2023 0953   GFRNONAA >60 01/11/2023 1122   GFRAA >60 04/24/2018 1108     Assessment/Plan:   Cholelithiasis Common bile duct dilation Colicky RUQ abdominal pain Upper abdominal pain Patient with longstanding history of cholelithiasis and colicky RUQ abdominal pain, previously evaluated by surgery in 2020 and advised to have cholecystectomy.  A couple months ago had recurrence of RUQ pain which is stabbing in nature and can radiate to her back or to her epigastrium/LUQ.  She has minimal tenderness on exam today and denies associated nausea, vomiting, fever, chills, jaundice.  Denies recurrence of sharp/stabbing pain since  episode 2 months ago.  RUQ US  08/06/2023 showed dilation of the CBD to 10 mm with no intrahepatic biliary ductal dilation or visualized choledocholithiasis.  Decompressed gallbladder was full of stones.  Follow-up MRI was recommended.    Normal CMP without elevation in liver enzymes or total bilirubin on labs 03/2023.  - Check labs today: CBC, CMP, lipase - Order MRCP, pending findings may need ERCP and if no evidence of choledocholithiasis will refer for surgical consult.  Iron deficiency anemia Patient with history of iron deficiency anemia, with history of irregular menstrual cycles and intermittent heavy menstrual bleeding.  She has been advised to take oral iron supplement, is not taking this consistently.  She denies evidence of GI bleeding including BRBPR or melena.  - Repeat iron/ferritin - Advised to take daily iron supplement and discuss with PCP if unable to tolerate oral iron for consideration of iron infusions.   Camie Furbish, PA-C [facility] 09/20/2023, 12:43 PM  Patient Care Team: Knute Thersia Bitters, FNP as PCP - General (Family Medicine)

## 2023-09-22 ENCOUNTER — Telehealth: Payer: Self-pay | Admitting: Gastroenterology

## 2023-09-22 ENCOUNTER — Ambulatory Visit: Payer: Self-pay | Admitting: Gastroenterology

## 2023-09-22 NOTE — Telephone Encounter (Signed)
 Dr. Wilhelmenia has advised that we go ahead and place surgical referral for this patient so she can get established with them as she likely will need cholecystectomy. If she is agreeable, please place referral to surgery for symptomatic cholelithiasis. Thank you

## 2023-09-22 NOTE — Progress Notes (Signed)
 Attending Physician's Attestation   I have reviewed the chart.   I agree with the Advanced Practitioner's note, impression, and recommendations with any updates as below. MRI/MRCP makes sense as next step in evaluation.  If positive for choledocholithiasis, will need ERCP.  Otherwise cholecystectomy makes most sense in setting of her known cholelithiasis.  If patient is agreeable, I would recommend going ahead and placing referral to surgery to prepare her for likely cholecystectomy.   Aloha Finner, MD [facility] Advanced [facility]oscopy Office # [phone]

## 2023-09-23 ENCOUNTER — Ambulatory Visit (HOSPITAL_COMMUNITY): Admission: RE | Admit: 2023-09-23 | Source: Ambulatory Visit

## 2023-09-24 NOTE — Telephone Encounter (Signed)
 LMTCB and let us  know if she is agreeable to referral to [facility].

## 2023-09-24 NOTE — Telephone Encounter (Signed)
 Portal msg sent as well.

## 2023-09-27 NOTE — Telephone Encounter (Signed)
 LMTCB in regards to possible referral and lab results.

## 2023-09-30 NOTE — Telephone Encounter (Signed)
 LMTCB in regards to possible referral and lab results.     Letter mailed as we have been unable to reach her 3 times.

## 2023-10-01 ENCOUNTER — Other Ambulatory Visit: Payer: Self-pay | Admitting: Gastroenterology

## 2023-10-01 ENCOUNTER — Ambulatory Visit (HOSPITAL_COMMUNITY): Admission: RE | Admit: 2023-10-01 | Source: Ambulatory Visit

## 2023-10-01 ENCOUNTER — Ambulatory Visit (HOSPITAL_COMMUNITY)

## 2023-10-01 ENCOUNTER — Encounter (HOSPITAL_COMMUNITY): Payer: Self-pay

## 2023-10-01 DIAGNOSIS — K8020 Calculus of gallbladder without cholecystitis without obstruction: Secondary | ICD-10-CM

## 2023-10-01 DIAGNOSIS — R1010 Upper abdominal pain, unspecified: Secondary | ICD-10-CM

## 2023-10-01 DIAGNOSIS — K838 Other specified diseases of biliary tract: Secondary | ICD-10-CM

## 2023-10-01 DIAGNOSIS — R1011 Right upper quadrant pain: Secondary | ICD-10-CM

## 2023-10-02 ENCOUNTER — Ambulatory Visit (HOSPITAL_COMMUNITY)
Admission: RE | Admit: 2023-10-02 | Discharge: 2023-10-02 | Disposition: A | Discharge status: Home / Self Care | Source: Ambulatory Visit | Attending: Gastroenterology | Admitting: Gastroenterology

## 2023-10-02 DIAGNOSIS — K838 Other specified diseases of biliary tract: Secondary | ICD-10-CM | POA: Insufficient documentation

## 2023-10-02 DIAGNOSIS — R1010 Upper abdominal pain, unspecified: Secondary | ICD-10-CM | POA: Insufficient documentation

## 2023-10-02 DIAGNOSIS — K8020 Calculus of gallbladder without cholecystitis without obstruction: Secondary | ICD-10-CM | POA: Diagnosis not present

## 2023-10-02 DIAGNOSIS — K8070 Calculus of gallbladder and bile duct without cholecystitis without obstruction: Secondary | ICD-10-CM | POA: Diagnosis not present

## 2023-10-02 DIAGNOSIS — R932 Abnormal findings on diagnostic imaging of liver and biliary tract: Secondary | ICD-10-CM | POA: Diagnosis not present

## 2023-10-02 DIAGNOSIS — R1011 Right upper quadrant pain: Secondary | ICD-10-CM | POA: Insufficient documentation

## 2023-10-02 MED ORDER — GADOBUTROL 1 MMOL/ML IV SOLN
10.0000 mL | Freq: Once | INTRAVENOUS | Status: AC | PRN
  Administered 2023-10-02: 10 mL via INTRAVENOUS

## 2023-10-06 ENCOUNTER — Other Ambulatory Visit: Payer: Self-pay

## 2023-10-06 DIAGNOSIS — K838 Other specified diseases of biliary tract: Secondary | ICD-10-CM

## 2023-10-06 DIAGNOSIS — K8020 Calculus of gallbladder without cholecystitis without obstruction: Secondary | ICD-10-CM

## 2023-10-06 NOTE — Progress Notes (Signed)
 Left message on machine to call back

## 2023-10-07 NOTE — Progress Notes (Signed)
 I have been unable to reach the pt by phone all information has been mailed to the pt and sent to My Chart

## 2023-10-07 NOTE — Progress Notes (Signed)
 Left message on machine to call back

## 2023-10-15 ENCOUNTER — Telehealth: Payer: Self-pay | Admitting: Gastroenterology

## 2023-10-15 NOTE — Telephone Encounter (Signed)
 Received call from patient, states she came in for MRI a week ago, She would like to speak to a nurse for in depth review on MRI results. Patient did not see my chart message, Please review and advise,  Thank you

## 2023-10-15 NOTE — Telephone Encounter (Signed)
 Attempted to reach pt and line will not go thru will attempt later

## 2023-10-15 NOTE — Telephone Encounter (Signed)
 Attempted to reach pt again and line did ring however the voicemail is full. Will  try later

## 2023-10-23 ENCOUNTER — Emergency Department (HOSPITAL_COMMUNITY)

## 2023-10-23 ENCOUNTER — Encounter (HOSPITAL_COMMUNITY): Payer: Self-pay

## 2023-10-23 ENCOUNTER — Observation Stay (HOSPITAL_COMMUNITY)
Admission: EM | Admit: 2023-10-23 | Discharge: 2023-10-27 | Disposition: A | Discharge status: Home / Self Care | Attending: Internal Medicine | Admitting: Internal Medicine

## 2023-10-23 ENCOUNTER — Other Ambulatory Visit: Payer: Self-pay

## 2023-10-23 DIAGNOSIS — R7303 Prediabetes: Secondary | ICD-10-CM | POA: Diagnosis not present

## 2023-10-23 DIAGNOSIS — R7989 Other specified abnormal findings of blood chemistry: Secondary | ICD-10-CM | POA: Insufficient documentation

## 2023-10-23 DIAGNOSIS — K8050 Calculus of bile duct without cholangitis or cholecystitis without obstruction: Principal | ICD-10-CM | POA: Insufficient documentation

## 2023-10-23 DIAGNOSIS — Z91040 Latex allergy status: Secondary | ICD-10-CM | POA: Diagnosis not present

## 2023-10-23 DIAGNOSIS — R109 Unspecified abdominal pain: Secondary | ICD-10-CM | POA: Diagnosis present

## 2023-10-23 DIAGNOSIS — D500 Iron deficiency anemia secondary to blood loss (chronic): Secondary | ICD-10-CM | POA: Insufficient documentation

## 2023-10-23 DIAGNOSIS — R030 Elevated blood-pressure reading, without diagnosis of hypertension: Secondary | ICD-10-CM | POA: Insufficient documentation

## 2023-10-23 DIAGNOSIS — R1011 Right upper quadrant pain: Secondary | ICD-10-CM | POA: Insufficient documentation

## 2023-10-23 HISTORY — DX: Family history of other specified conditions: Z84.89

## 2023-10-23 LAB — CBC
HCT: 36 % (ref 36.0–46.0)
Hemoglobin: 10.5 g/dL — ABNORMAL LOW (ref 12.0–15.0)
MCH: 24.8 pg — ABNORMAL LOW (ref 26.0–34.0)
MCHC: 29.2 g/dL — ABNORMAL LOW (ref 30.0–36.0)
MCV: 84.9 fL (ref 80.0–100.0)
Platelets: 363 K/uL (ref 150–400)
RBC: 4.24 MIL/uL (ref 3.87–5.11)
RDW: 19.6 % — ABNORMAL HIGH (ref 11.5–15.5)
WBC: 6.1 K/uL (ref 4.0–10.5)
nRBC: 0 % (ref 0.0–0.2)

## 2023-10-23 LAB — URINALYSIS, ROUTINE W REFLEX MICROSCOPIC
Bilirubin Urine: NEGATIVE
Glucose, UA: NEGATIVE mg/dL
Hgb urine dipstick: NEGATIVE
Ketones, ur: NEGATIVE mg/dL
Leukocytes,Ua: NEGATIVE
Nitrite: NEGATIVE
Protein, ur: NEGATIVE mg/dL
Specific Gravity, Urine: 1.024 (ref 1.005–1.030)
pH: 5 (ref 5.0–8.0)

## 2023-10-23 LAB — COMPREHENSIVE METABOLIC PANEL WITH GFR
ALT: 108 U/L — ABNORMAL HIGH (ref 0–44)
AST: 151 U/L — ABNORMAL HIGH (ref 15–41)
Albumin: 3.3 g/dL — ABNORMAL LOW (ref 3.5–5.0)
Alkaline Phosphatase: 264 U/L — ABNORMAL HIGH (ref 38–126)
Anion gap: 9 (ref 5–15)
BUN: 14 mg/dL (ref 6–20)
CO2: 26 mmol/L (ref 22–32)
Calcium: 9 mg/dL (ref 8.9–10.3)
Chloride: 105 mmol/L (ref 98–111)
Creatinine, Ser: 0.89 mg/dL (ref 0.44–1.00)
GFR, Estimated: >60 mL/min (ref >60)
Glucose, Bld: 89 mg/dL (ref 70–99)
Potassium: 4.3 mmol/L (ref 3.5–5.1)
Sodium: 140 mmol/L (ref 135–145)
Total Bilirubin: 0.7 mg/dL (ref 0.0–1.2)
Total Protein: 7.6 g/dL (ref 6.5–8.1)

## 2023-10-23 LAB — PROTIME-INR
INR: 0.9 (ref 0.8–1.2)
Prothrombin Time: 13.2 s (ref 11.4–15.2)

## 2023-10-23 LAB — HCG, SERUM, QUALITATIVE: Preg, Serum: NEGATIVE

## 2023-10-23 LAB — LIPASE, BLOOD: Lipase: 33 U/L (ref 11–51)

## 2023-10-23 LAB — LACTIC ACID, PLASMA: Lactic Acid, Venous: 0.8 mmol/L (ref 0.5–1.9)

## 2023-10-23 LAB — MAGNESIUM: Magnesium: 2 mg/dL (ref 1.7–2.4)

## 2023-10-23 MED ORDER — ONDANSETRON HCL 4 MG/2ML IJ SOLN
4.0000 mg | Freq: Once | INTRAMUSCULAR | Status: AC
  Administered 2023-10-23: 4 mg via INTRAVENOUS
  Filled 2023-10-23: qty 2

## 2023-10-23 MED ORDER — ONDANSETRON HCL 4 MG PO TABS: 4.0000 mg | ORAL_TABLET | Freq: Four times a day (QID) | ORAL | Status: DC | PRN

## 2023-10-23 MED ORDER — SODIUM CHLORIDE 0.9 % IV SOLN: INTRAVENOUS | Status: AC

## 2023-10-23 MED ORDER — ONDANSETRON HCL 4 MG/2ML IJ SOLN: 4.0000 mg | Freq: Four times a day (QID) | INTRAMUSCULAR | Status: DC | PRN

## 2023-10-23 MED ORDER — FENTANYL CITRATE PF 50 MCG/ML IJ SOSY
50.0000 ug | PREFILLED_SYRINGE | Freq: Once | INTRAMUSCULAR | Status: AC
  Administered 2023-10-23: 50 ug via INTRAVENOUS
  Filled 2023-10-23: qty 1

## 2023-10-23 MED ORDER — METOPROLOL TARTRATE 5 MG/5ML IV SOLN
5.0000 mg | Freq: Four times a day (QID) | INTRAVENOUS | Status: DC | PRN
  Filled 2023-10-23: qty 5

## 2023-10-23 MED ORDER — MORPHINE SULFATE (PF) 2 MG/ML IV SOLN
2.0000 mg | INTRAVENOUS | Status: DC | PRN
  Administered 2023-10-25: 2 mg via INTRAVENOUS
  Filled 2023-10-23: qty 1

## 2023-10-23 MED ORDER — TRAMADOL HCL 50 MG PO TABS: 50.0000 mg | ORAL_TABLET | Freq: Three times a day (TID) | ORAL | Status: DC | PRN

## 2023-10-23 NOTE — Assessment & Plan Note (Signed)
 33 year old with known cholelithiasis and choledocholithiasis presenting to ED with one week history of worsening RUQ abdominal pain found to have elevated LFTs from known choledocholithiasis  -admit to med surg -known to [facility] with recent MRCP with findings of of cholelithiasis and choledocholithiasis with dilated common duct measuring up to 10 mm. No significant intrahepatic ductal dilation. She was scheduled for ERCP in September  -US  today shows contracted gallbladder and cholelithiasis, dilated CBD with intrahepatic biliary dilatation.  -[facility] consulted -NPO midnight -MRCP ordered with plans for ERCP  -no signs of cholecystitis, check lactic acid, no leukocytosis  -gentle IVF -pain control and anti emetics  -will need surgical consult following ERCP

## 2023-10-23 NOTE — ED Triage Notes (Signed)
 Pt is her for abdominal pain. Pt stated she had gallbladder issues and she has pain from gallstones that started a week ago.

## 2023-10-23 NOTE — Assessment & Plan Note (Addendum)
 Hgb appears better than baseline Hx of heavy periods Iron studies done in July  Supposed to be on iron pills, but has not been taking them

## 2023-10-23 NOTE — Assessment & Plan Note (Signed)
 A1C of 6.0 in January 2025 Continue diet/lifestyle changes

## 2023-10-23 NOTE — H&P (Signed)
 History and Physical    Patient: Cheryl [name] FMW:[id number] DOB: March 06, 1990 DOA: 10/23/2023 DOS: the patient was seen and examined on 10/23/2023 PCP: Knute Thersia Bitters, FNP  Patient coming from: Home - lives with a roommate. Ambulates independently    Chief Complaint: abdominal pain   HPI: Cheryl [name] is a 33 y.o. female with medical history significant of IDA, pre diabetes, cholelithiasis/choledocholithiasis who presented to ED with complaints of worsening abdominal pain.  She states she started to have some mild pain about one week ago. She states today at work it got progressively worse and describes pain as sharp and rated as an 8/10. Pain is in the RUQ quadrant and radiates to her back. Pain is intermittent. She denies pain worse with eating. She has had mild nausea, no vomiting. No fever/chills. She has had normal PO intake. Pain worse with breathing. Pain seems to go way on it's own. She has some diarrhea.   Seen by [facility] in July for cholelithiasis. RUQ US  on 08/06/23 showed dilation of the CBD to 10mm with no intrahepatic biliary ductal dilation or visualized choledocholithiasis. Decompressed gallbladder was full of stones. MRCP done 10/2023 with findings of  Cholelithiasis and choledocholithiasis with dilated common duct measuring up to 10 mm. No significant intrahepatic ductal dilation. No MRI findings of acute cholecystitis. Recommended ERCP, but appears she has not been contacted regarding this.   Denies any fever/chills, vision changes/headaches, chest pain or palpitations, shortness of breath or cough, vomiting, dysuria or leg swelling.   She does not smoke or drink alcohol.   ER Course:  vitals: afebrile, bp: 153/100, HR: 100, RR: 20, oxygen: 95%RA Pertinent labs: hgb: 10.5, AST: 151, ALT: 108, alk phos: 264 US  abdomen: 1. Contracted gallbladder and subsequently limited study. 2. Cholelithiasis. 3. Gallbladder wall thickening which is likely, in part, secondary to  the contracted nature of the gallbladder. 4. Dilated common bile duct with intrahepatic biliary dilatation. Further evaluation with nonemergent MRCP is recommended. 5. Hepatic steatosis. In ED: GI consulted. MRCP ordered. Given zofran  and fentanyl . TRH asked to admit.    Review of Systems: As mentioned in the history of present illness. All other systems reviewed and are negative. Past Medical History:  Diagnosis Date   Allergy 2018   Anemia    Anxiety    Depression    Gall stones    History reviewed. No pertinent surgical history. Social History:  reports that she has never smoked. She has never used smokeless tobacco. She reports that she does not drink alcohol and does not use drugs.  Allergies  Allergen Reactions   Shellfish Allergy Shortness Of Breath   Latex Itching and Rash    Powder inside the glove    Family History  Problem Relation Age of Onset   Depression Mother    Diabetes Mother    Obesity Mother    Arthritis Mother    Diabetes Maternal Grandmother    Heart disease Maternal Grandmother    Obesity Maternal Grandmother    Arthritis Maternal Grandmother    Stroke Maternal Grandfather    Liver disease Neg Hx    Esophageal cancer Neg Hx    Colon cancer Neg Hx     Prior to Admission medications   Medication Sig Start Date End Date Taking? Authorizing Provider  acetaminophen  (TYLENOL ) 500 MG tablet Take 1,000 mg by mouth 2 (two) times daily as needed for moderate pain (pain score 4-6) or headache.    [provider]  Cholecalciferol   1.25 MG (50000 UT) capsule Take 1 capsule (50,000 Units total) by mouth once a week. 03/25/23   Caudle, Thersia Bitters, FNP  ferrous sulfate  325 (65 FE) MG tablet Take 1 tablet (325 mg total) by mouth every other day. 03/23/23   Caudle, Thersia Bitters, FNP  ibuprofen  (ADVIL ) 200 MG tablet Take 400 mg by mouth 2 (two) times daily as needed for moderate pain (pain score 4-6) or headache.    [provider]    Physical  Exam: Vitals:   10/23/23 1841 10/23/23 1931  BP: (!) 153/100 (!) 145/102  Pulse: 100 97  Resp: 20 18  Temp: 98.4 F (36.9 C)   TempSrc: Oral   SpO2: 95% 99%   General:  Appears calm and comfortable and is in NAD. Obese  Eyes:  PERRL, EOMI, normal lids, iris ENT:  grossly normal hearing, lips & tongue, mmm; appropriate dentition Neck:  no LAD, masses or thyromegaly; no carotid bruits Cardiovascular:  RRR, no m/r/g. No LE edema.  Respiratory:   CTA bilaterally with no wheezes/rales/rhonchi.  Normal respiratory effort. Abdomen:  soft, TTP in RUQ.+ murphy sign. No rebound or guarding. BS+ Back:   normal alignment, no CVAT Skin:  no rash or induration seen on limited exam Musculoskeletal:  grossly normal tone BUE/BLE, good ROM, no bony abnormality Lower extremity:  No LE edema.  Limited foot exam with no ulcerations.  2+ distal pulses. Psychiatric:  grossly normal mood and affect, speech fluent and appropriate, AOx3 Neurologic:  CN 2-12 grossly intact, moves all extremities in coordinated fashion, sensation intact   Radiological Exams on Admission: Independently reviewed - see discussion in A/P where applicable  US  Abdomen Limited Result Date: 10/23/2023 CLINICAL DATA:  Right upper quadrant pain. EXAM: ULTRASOUND ABDOMEN LIMITED RIGHT UPPER QUADRANT COMPARISON:  None Available. FINDINGS: Gallbladder: The gallbladder is contracted and subsequently limited in evaluation. Multiple gallstones are seen within the gallbladder lumen. The largest measures approximately 9.7 mm. Gallbladder wall measures 5.1 mm in thickness. No sonographic Murphy sign noted by sonographer. Common bile duct: Diameter: 15.2 mm (measured 10 mm on the prior study) Liver: No focal lesion identified. Intrahepatic biliary dilatation is seen. Diffusely increased echogenicity of the liver parenchyma is noted. Portal vein is patent on color Doppler imaging with normal direction of blood flow towards the liver. Other: None.  IMPRESSION: 1. Contracted gallbladder and subsequently limited study. 2. Cholelithiasis. 3. Gallbladder wall thickening which is likely, in part, secondary to the contracted nature of the gallbladder. 4. Dilated common bile duct with intrahepatic biliary dilatation. Further evaluation with nonemergent MRCP is recommended. 5. Hepatic steatosis. Electronically Signed   By: Suzen Dials M.D.   On: 10/23/2023 20:23    Labs on Admission: I have personally reviewed the available labs and imaging studies at the time of the admission.  Pertinent labs:   hgb: 10.5,  AST: 151,  ALT: 108,  alk phos: 264  Assessment and Plan: Principal Problem:   Choledocholithiasis Active Problems:   Elevated blood pressure reading   Iron deficiency anemia due to chronic blood loss   Prediabetes    Assessment and Plan: * Choledocholithiasis 33 year old with known cholelithiasis and choledocholithiasis presenting to ED with one week history of worsening RUQ abdominal pain found to have elevated LFTs from known choledocholithiasis  -admit to med surg -known to [facility] with recent MRCP with findings of of cholelithiasis and choledocholithiasis with dilated common duct measuring up to 10 mm. No significant intrahepatic ductal dilation. She was  scheduled for ERCP in September  -US  today shows contracted gallbladder and cholelithiasis, dilated CBD with intrahepatic biliary dilatation.  -[facility] consulted -NPO midnight -MRCP ordered with plans for ERCP  -no signs of cholecystitis, check lactic acid, no leukocytosis  -gentle IVF -pain control and anti emetics  -will need surgical consult following ERCP   Elevated blood pressure reading Quite elevated in hospital, but could be due to pain Elevated readings at outpatient setting as well.  PRN IV labetalol for now, but may need addition of medication for HTN Needs sleep study out patient as well, concern for OSA   Iron deficiency anemia due to chronic  blood loss Hgb appears better than baseline Hx of heavy periods Iron studies done in July  Supposed to be on iron pills, but has not been taking them   Prediabetes A1C of 6.0 in January 2025 Continue diet/lifestyle changes        Advance Care Planning:   Code Status: Full Code   Consults: GI: Dr. Federico   DVT Prophylaxis: SCDs   Family Communication: none   Severity of Illness: The appropriate patient status for this patient is INPATIENT. Inpatient status is judged to be reasonable and necessary in order to provide the required intensity of service to ensure the patient's safety. The patient's presenting symptoms, physical exam findings, and initial radiographic and laboratory data in the context of their chronic comorbidities is felt to place them at high risk for further clinical deterioration. Furthermore, it is not anticipated that the patient will be medically stable for discharge from the hospital within 2 midnights of admission.   * I certify that at the point of admission it is my clinical judgment that the patient will require inpatient hospital care spanning beyond 2 midnights from the point of admission due to high intensity of service, high risk for further deterioration and high frequency of surveillance required.*  Author: Isaiah Geralds, MD 10/23/2023 9:53 PM  For on call review [email or web address].

## 2023-10-23 NOTE — Assessment & Plan Note (Signed)
 Quite elevated in hospital, but could be due to pain Elevated readings at outpatient setting as well.  PRN IV labetalol for now, but may need addition of medication for HTN Needs sleep study out patient as well, concern for OSA

## 2023-10-23 NOTE — ED Provider Notes (Signed)
 [facility] AT [facility] Provider Note   CSN: [id number] Arrival date & time: 10/23/23  8162     Patient presents with: Abdominal Pain   Cheryl [name] is a 33 y.o. female.    Abdominal Pain (Abdominal pain.  Upper abdomen.  Has had known gallstones.  Review of notes appears that has had choledocholithiasis on recent MRCP.  Has had an order for ERCP but patient states she was not notified of this.  With records appears that there were not able to get through to her.  Sees [facility].  Over the last week however pain has been more severe.  Upper abdomen.  Decreased oral intake.  Potentially worse with eating.  No fevers.     Prior to Admission medications   Medication Sig Start Date End Date Taking? Authorizing Provider  acetaminophen  (TYLENOL ) 500 MG tablet Take 1,000 mg by mouth 2 (two) times daily as needed for moderate pain (pain score 4-6) or headache.    [provider]  Cholecalciferol  1.25 MG (50000 UT) capsule Take 1 capsule (50,000 Units total) by mouth once a week. 03/25/23   CaudleThersia Bitters, FNP  ferrous sulfate  325 (65 FE) MG tablet Take 1 tablet (325 mg total) by mouth every other day. 03/23/23   Caudle, Thersia Bitters, FNP  ibuprofen  (ADVIL ) 200 MG tablet Take 400 mg by mouth 2 (two) times daily as needed for moderate pain (pain score 4-6) or headache.    [provider]    Allergies: Shellfish allergy and Latex    Review of Systems  Gastrointestinal:  Positive for abdominal pain.    Updated Vital Signs BP (!) 145/102   Pulse 97   Temp 98.4 F (36.9 C) (Oral)   Resp 18   SpO2 99%   Physical Exam Vitals reviewed.  Abdominal:     Tenderness: There is abdominal tenderness.     Comments: Tenderness upper abdomen.  No rebound or guarding.  No hernia palpated  Neurological:     Mental Status: She is alert.     (all labs ordered are listed, but only abnormal results are displayed) Labs Reviewed   COMPREHENSIVE METABOLIC PANEL WITH GFR - Abnormal; Notable for the following components:      Result Value   Albumin 3.3 (*)    AST 151 (*)    ALT 108 (*)    Alkaline Phosphatase 264 (*)    All other components within normal limits  CBC - Abnormal; Notable for the following components:   Hemoglobin 10.5 (*)    MCH 24.8 (*)    MCHC 29.2 (*)    RDW 19.6 (*)    All other components within normal limits  URINALYSIS, ROUTINE W REFLEX MICROSCOPIC - Abnormal; Notable for the following components:   APPearance HAZY (*)    All other components within normal limits  LIPASE, BLOOD  HCG, SERUM, QUALITATIVE    EKG: None  Radiology: US  Abdomen Limited Result Date: 10/23/2023 CLINICAL DATA:  Right upper quadrant pain. EXAM: ULTRASOUND ABDOMEN LIMITED RIGHT UPPER QUADRANT COMPARISON:  None Available. FINDINGS: Gallbladder: The gallbladder is contracted and subsequently limited in evaluation. Multiple gallstones are seen within the gallbladder lumen. The largest measures approximately 9.7 mm. Gallbladder wall measures 5.1 mm in thickness. No sonographic Murphy sign noted by sonographer. Common bile duct: Diameter: 15.2 mm (measured 10 mm on the prior study) Liver: No focal lesion identified. Intrahepatic biliary dilatation is seen. Diffusely increased echogenicity of the liver parenchyma  is noted. Portal vein is patent on color Doppler imaging with normal direction of blood flow towards the liver. Other: None. IMPRESSION: 1. Contracted gallbladder and subsequently limited study. 2. Cholelithiasis. 3. Gallbladder wall thickening which is likely, in part, secondary to the contracted nature of the gallbladder. 4. Dilated common bile duct with intrahepatic biliary dilatation. Further evaluation with nonemergent MRCP is recommended. 5. Hepatic steatosis. Electronically Signed   By: Suzen Dials M.D.   On: 10/23/2023 20:23     Procedures   Medications Ordered in the ED  fentaNYL  (SUBLIMAZE ) injection  50 mcg (50 mcg Intravenous Given 10/23/23 2043)  ondansetron  (ZOFRAN ) injection 4 mg (4 mg Intravenous Given 10/23/23 2042)                                    Medical Decision Making Amount and/or Complexity of Data Reviewed Labs: ordered. Radiology: ordered.  Risk Prescription drug management.   Patient with upper abdominal pain.  Reviewed previous GI notes.  Plans for ERCP due to common duct stone.  However worsening pain now.  LFTs now more elevated with normal bilirubin.  Ultrasound does show worsening dilatation of the common duct along with potential thickening the gallbladder wall.  Discussed with Dr. Federico from [facility].  Recommends keeping patient n.p.o. at midnight.  Reviewing records she requests repeating MRCP to determine need for intervention.  Will admit to hospitalist.  Does not appear infected at this time.    Final diagnoses:  Choledocholithiasis    ED Discharge Orders     None          Patsey Lot, MD 10/23/23 2059

## 2023-10-24 DIAGNOSIS — K8050 Calculus of bile duct without cholangitis or cholecystitis without obstruction: Secondary | ICD-10-CM | POA: Diagnosis not present

## 2023-10-24 DIAGNOSIS — K8001 Calculus of gallbladder with acute cholecystitis with obstruction: Secondary | ICD-10-CM | POA: Diagnosis not present

## 2023-10-24 DIAGNOSIS — Z6841 Body Mass Index (BMI) 40.0 and over, adult: Secondary | ICD-10-CM | POA: Diagnosis not present

## 2023-10-24 DIAGNOSIS — R7989 Other specified abnormal findings of blood chemistry: Secondary | ICD-10-CM | POA: Diagnosis not present

## 2023-10-24 DIAGNOSIS — R110 Nausea: Secondary | ICD-10-CM | POA: Diagnosis not present

## 2023-10-24 LAB — COMPREHENSIVE METABOLIC PANEL WITH GFR
ALT: 254 U/L — ABNORMAL HIGH (ref 0–44)
AST: 452 U/L — ABNORMAL HIGH (ref 15–41)
Albumin: 3.1 g/dL — ABNORMAL LOW (ref 3.5–5.0)
Alkaline Phosphatase: 288 U/L — ABNORMAL HIGH (ref 38–126)
Anion gap: 10 (ref 5–15)
BUN: 11 mg/dL (ref 6–20)
CO2: 24 mmol/L (ref 22–32)
Calcium: 8.5 mg/dL — ABNORMAL LOW (ref 8.9–10.3)
Chloride: 105 mmol/L (ref 98–111)
Creatinine, Ser: 0.76 mg/dL (ref 0.44–1.00)
GFR, Estimated: >60 mL/min (ref >60)
Glucose, Bld: 91 mg/dL (ref 70–99)
Potassium: 3.9 mmol/L (ref 3.5–5.1)
Sodium: 139 mmol/L (ref 135–145)
Total Bilirubin: 0.7 mg/dL (ref 0.0–1.2)
Total Protein: 6.5 g/dL (ref 6.5–8.1)

## 2023-10-24 LAB — LACTIC ACID, PLASMA: Lactic Acid, Venous: 0.6 mmol/L (ref 0.5–1.9)

## 2023-10-24 LAB — CBC
HCT: 33.7 % — ABNORMAL LOW (ref 36.0–46.0)
Hemoglobin: 9.8 g/dL — ABNORMAL LOW (ref 12.0–15.0)
MCH: 24.7 pg — ABNORMAL LOW (ref 26.0–34.0)
MCHC: 29.1 g/dL — ABNORMAL LOW (ref 30.0–36.0)
MCV: 85.1 fL (ref 80.0–100.0)
Platelets: 300 K/uL (ref 150–400)
RBC: 3.96 MIL/uL (ref 3.87–5.11)
RDW: 19.6 % — ABNORMAL HIGH (ref 11.5–15.5)
WBC: 5.3 K/uL (ref 4.0–10.5)
nRBC: 0 % (ref 0.0–0.2)

## 2023-10-24 LAB — HIV ANTIBODY (ROUTINE TESTING W REFLEX): HIV Screen 4th Generation wRfx: NONREACTIVE

## 2023-10-24 MED ORDER — SODIUM CHLORIDE 0.9 % IV SOLN
1.5000 g | INTRAVENOUS | Status: DC
  Filled 2023-10-24: qty 4

## 2023-10-24 NOTE — Progress Notes (Signed)
 PROGRESS NOTE  Cheryl [name]  FMW:[id number] DOB: 05-25-1990 DOA: 10/23/2023 PCP: Knute Thersia Bitters, FNP   Brief Narrative: Patient is a 33 year old female with history of iron deficiency anemia, prediabetes, cholelithiasis/choledocholithiasis who presented with complaint of abdominal pain mainly in the right upper quadrant, mild nausea.  No vomiting, fever or chills.  Her right upper quadrant USG back on 08/06/2023 showed dilation of CBD with no intrahepatic biliary duct dilatation or visualized choledocholithiasis.  MRCP on 8/25 showed cholelithiasis, choledocholithiasis with dilated common bile duct measuring up to 10 mm.  No signs of cholecystitis.  On presentation, she was hemodynamically stable.  Lab work showed AST of 151, ALT of 108.  Ultrasound of the abdomen showed contracted gallbladder, cholelithiasis, gallbladder wall thickening, dilated CBD with intrahepatic biliary dilatation.  GI consulted.  Plan for MRCP.  General surgery also consulted for consideration of cholecystectomy  Assessment & Plan:  Principal Problem:   Choledocholithiasis Active Problems:   Elevated blood pressure reading   Iron deficiency anemia due to chronic blood loss   Prediabetes  Choledocholithiasis/cholelithiasis: Presented with right upper quadrant pain, elevated liver enzymes.  Imaging as above.  GI already following.  Plan for MRCP.  Might need ERCP for stone removal.  Obtained  surgery consultation for consideration of cholecystectomy Liver enzymes trended up today. Low suspicion for cholangitis.  No fever or chills or leukocytosis.  Complains of some right upper quadrant discomfort but no nausea or vomiting.  History of iron deficiency anemia: Due to menorrhagia.  Currently hemoglobin stable.  Prediabetes: A1c of 6.  We recommend diet modification, lifestyle changes  Morbid obesity: BMI of 60.6. We recommend diet modification, lifestyle changes           DVT prophylaxis:SCDs Start:  10/23/23 2115     Code Status: Full Code  Family Communication: None at the bedside  Patient status:Inpatient  Patient is from :Home  Anticipated discharge un:Ynfz  Estimated DC date:1-2 days   Consultants: GI ,General Surgery  Procedures:None  Antimicrobials:  Anti-infectives (From admission, onward)    None       Subjective: Patient seen and examined at bedside today.  Hemodynamically stable.  Overall comfortable.  Lying on bed.  No nausea or vomiting.  Has some right upper quadrant discomfort.  No fever or chills  Objective: Vitals:   10/23/23 2250 10/24/23 0156 10/24/23 0200 10/24/23 0451  BP: 129/76  119/72 120/68  Pulse: 67  79 70  Resp: 18  18 18   Temp: 98.7 F (37.1 C)  98.2 F (36.8 C) 98.2 F (36.8 C)  TempSrc: Oral  Oral Oral  SpO2: 99%  94% 98%  Weight:  (!) 155.3 kg    Height: 5' 3 (1.6 m)       Intake/Output Summary (Last 24 hours) at 10/24/2023 0736 Last data filed at 10/24/2023 0600 Gross per 24 hour  Intake 514.72 ml  Output --  Net 514.72 ml   Filed Weights   10/24/23 0156  Weight: (!) 155.3 kg    Examination:    General exam: Overall comfortable, not in distress, morbidly obese HEENT: PERRL Respiratory system:  no wheezes or crackles  Cardiovascular system: S1 & S2 heard, RRR.  Gastrointestinal system: Abdomen is nondistended, soft .  Mild tenderness in the right upper quadrant Central nervous system: Alert and oriented Extremities: No edema, no clubbing ,no cyanosis Skin: No rashes, no ulcers,no icterus       Data Reviewed: I have personally reviewed following labs and imaging studies  CBC: Recent Labs  Lab 10/23/23 1929 10/24/23 0247  WBC 6.1 5.3  HGB 10.5* 9.8*  HCT 36.0 33.7*  MCV 84.9 85.1  PLT 363 300   Basic Metabolic Panel: Recent Labs  Lab 10/23/23 1929 10/23/23 2212 10/24/23 0247  NA 140  --  139  K 4.3  --  3.9  CL 105  --  105  CO2 26  --  24  GLUCOSE 89  --  91  BUN 14  --  11  CREATININE  0.89  --  0.76  CALCIUM 9.0  --  8.5*  MG  --  2.0  --      No results found for this or any previous visit (from the past 240 hours).   Radiology Studies: US  Abdomen Limited Result Date: 10/23/2023 CLINICAL DATA:  Right upper quadrant pain. EXAM: ULTRASOUND ABDOMEN LIMITED RIGHT UPPER QUADRANT COMPARISON:  None Available. FINDINGS: Gallbladder: The gallbladder is contracted and subsequently limited in evaluation. Multiple gallstones are seen within the gallbladder lumen. The largest measures approximately 9.7 mm. Gallbladder wall measures 5.1 mm in thickness. No sonographic Murphy sign noted by sonographer. Common bile duct: Diameter: 15.2 mm (measured 10 mm on the prior study) Liver: No focal lesion identified. Intrahepatic biliary dilatation is seen. Diffusely increased echogenicity of the liver parenchyma is noted. Portal vein is patent on color Doppler imaging with normal direction of blood flow towards the liver. Other: None. IMPRESSION: 1. Contracted gallbladder and subsequently limited study. 2. Cholelithiasis. 3. Gallbladder wall thickening which is likely, in part, secondary to the contracted nature of the gallbladder. 4. Dilated common bile duct with intrahepatic biliary dilatation. Further evaluation with nonemergent MRCP is recommended. 5. Hepatic steatosis. Electronically Signed   By: Suzen Dials M.D.   On: 10/23/2023 20:23    Scheduled Meds: Continuous Infusions:  sodium chloride  75 mL/hr at 10/23/23 2345     LOS: 1 day   Ivonne Mustache, MD [facility] P8/24/2025, 7:36 AM

## 2023-10-24 NOTE — Consult Note (Signed)
 Reason for Consult:choledocholithiasis Referring Physician: Adhikari  Cheryl [name] is an 33 y.o. female.  HPI:  Patient is 33 year old female who presented to the emergency department with worsening abdominal pain for around a week.  She came to the emergency department when that became became unbearable.  She did not have relief until she got narcotics.  She also was found to have increased liver function tests.  MRCP was obtained showing dilation of the common bile duct with choledocholithiasis.  Ultrasound and MRI showed no evidence of cholecystitis.  Patient had classic gallbladder symptoms with pain worsening while eating.  This was also associated with nausea.  Patient denies jaundice, but has had dark urine.  Denies fevers and chills.  Patient has been having intermittent symptoms for several years with gallstones identified on a ultrasound going back as far as 2014.  Her mother and grandmother have had gallbladder issues as well.  Past Medical History:  Diagnosis Date   Allergy 2018   Anemia    Anxiety    Depression    Gall stones     History reviewed. No pertinent surgical history.  Family History  Problem Relation Age of Onset   Depression Mother    Diabetes Mother    Obesity Mother    Arthritis Mother    Diabetes Maternal Grandmother    Heart disease Maternal Grandmother    Obesity Maternal Grandmother    Arthritis Maternal Grandmother    Stroke Maternal Grandfather    Liver disease Neg Hx    Esophageal cancer Neg Hx    Colon cancer Neg Hx     Social History:  reports that she has never smoked. She has never used smokeless tobacco. She reports that she does not drink alcohol and does not use drugs.  Allergies:  Allergies  Allergen Reactions   Shellfish Allergy Shortness Of Breath   Latex Itching and Rash    Powder inside the glove    Medications:  Current Meds  Medication Sig   acetaminophen  (TYLENOL ) 500 MG tablet Take 1,000 mg by mouth 2 (two) times  daily as needed for moderate pain (pain score 4-6) or headache.   ibuprofen  (ADVIL ) 200 MG tablet Take 400 mg by mouth 2 (two) times daily as needed for moderate pain (pain score 4-6) or headache.     Results for orders placed or performed during the hospital encounter of 10/23/23 (from the past 48 hours)  Urinalysis, Routine w reflex microscopic -Urine, Clean Catch     Status: Abnormal   Collection Time: 10/23/23  6:58 PM  Result Value Ref Range   Color, Urine YELLOW YELLOW   APPearance HAZY (A) CLEAR   Specific Gravity, Urine 1.024 1.005 - 1.030   pH 5.0 5.0 - 8.0   Glucose, UA NEGATIVE NEGATIVE mg/dL   Hgb urine dipstick NEGATIVE NEGATIVE   Bilirubin Urine NEGATIVE NEGATIVE   Ketones, ur NEGATIVE NEGATIVE mg/dL   Protein, ur NEGATIVE NEGATIVE mg/dL   Nitrite NEGATIVE NEGATIVE   Leukocytes,Ua NEGATIVE NEGATIVE    Comment: Performed at [facility], [address] [place], [place] [address]  Lipase, blood     Status: None   Collection Time: 10/23/23  7:29 PM  Result Value Ref Range   Lipase 33 11 - 51 U/L    Comment: Performed at [facility], [address], [place], [place] [address]  Comprehensive metabolic panel     Status: Abnormal   Collection Time: 10/23/23  7:29 PM  Result  Value Ref Range   Sodium 140 135 - 145 mmol/L   Potassium 4.3 3.5 - 5.1 mmol/L   Chloride 105 98 - 111 mmol/L   CO2 26 22 - 32 mmol/L   Glucose, Bld 89 70 - 99 mg/dL    Comment: Glucose reference range applies only to samples taken after fasting for at least 8 hours.   BUN 14 6 - 20 mg/dL   Creatinine, Ser 9.10 0.44 - 1.00 mg/dL   Calcium 9.0 8.9 - 89.6 mg/dL   Total Protein 7.6 6.5 - 8.1 g/dL   Albumin 3.3 (L) 3.5 - 5.0 g/dL   AST 848 (H) 15 - 41 U/L   ALT 108 (H) 0 - 44 U/L   Alkaline Phosphatase 264 (H) 38 - 126 U/L   Total Bilirubin 0.7 0.0 - 1.2 mg/dL   GFR, Estimated >39 >39 mL/min    Comment: (NOTE) Calculated using the CKD-EPI Creatinine Equation  (2021)    Anion gap 9 5 - 15    Comment: Performed at [facility], [address] [place], [place] [address]  CBC     Status: Abnormal   Collection Time: 10/23/23  7:29 PM  Result Value Ref Range   WBC 6.1 4.0 - 10.5 K/uL   RBC 4.24 3.87 - 5.11 MIL/uL   Hemoglobin 10.5 (L) 12.0 - 15.0 g/dL   HCT 63.9 63.9 - 53.9 %   MCV 84.9 80.0 - 100.0 fL   MCH 24.8 (L) 26.0 - 34.0 pg   MCHC 29.2 (L) 30.0 - 36.0 g/dL   RDW 80.3 (H) 88.4 - 84.4 %   Platelets 363 150 - 400 K/uL   nRBC 0.0 0.0 - 0.2 %    Comment: Performed at [facility], [address] [place], [place] [address]  hCG, serum, qualitative     Status: None   Collection Time: 10/23/23  7:29 PM  Result Value Ref Range   Preg, Serum NEGATIVE NEGATIVE    Comment:        THE SENSITIVITY OF THIS METHODOLOGY IS >10 mIU/mL. Performed at [facility], [address] [place], [place] [address]   Magnesium     Status: None   Collection Time: 10/23/23 10:12 PM  Result Value Ref Range   Magnesium 2.0 1.7 - 2.4 mg/dL    Comment: Performed at [facility], [address] [place], [place] [address]  Protime-INR     Status: None   Collection Time: 10/23/23 10:12 PM  Result Value Ref Range   Prothrombin Time 13.2 11.4 - 15.2 seconds   INR 0.9 0.8 - 1.2    Comment: (NOTE) INR goal varies based on device and disease states. Performed at [facility], [address], [place], [place] [address]   Lactic acid, plasma     Status: None   Collection Time: 10/23/23 10:12 PM  Result Value Ref Range   Lactic Acid, Venous 0.8 0.5 - 1.9 mmol/L    Comment: Performed at [facility], [address] [place], [place] [address]  Comprehensive metabolic panel     Status: Abnormal   Collection Time: 10/24/23  2:47 AM  Result Value Ref Range   Sodium 139 135 - 145 mmol/L   Potassium 3.9 3.5 - 5.1 mmol/L   Chloride 105 98 - 111 mmol/L    CO2 24 22 - 32 mmol/L   Glucose, Bld 91 70 - 99 mg/dL    Comment:  Glucose reference range applies only to samples taken after fasting for at least 8 hours.   BUN 11 6 - 20 mg/dL   Creatinine, Ser 9.23 0.44 - 1.00 mg/dL   Calcium 8.5 (L) 8.9 - 10.3 mg/dL   Total Protein 6.5 6.5 - 8.1 g/dL   Albumin 3.1 (L) 3.5 - 5.0 g/dL   AST 547 (H) 15 - 41 U/L   ALT 254 (H) 0 - 44 U/L   Alkaline Phosphatase 288 (H) 38 - 126 U/L   Total Bilirubin 0.7 0.0 - 1.2 mg/dL   GFR, Estimated >39 >39 mL/min    Comment: (NOTE) Calculated using the CKD-EPI Creatinine Equation (2021)    Anion gap 10 5 - 15    Comment: Performed at [facility], [address], [place], [place] [address]  CBC     Status: Abnormal   Collection Time: 10/24/23  2:47 AM  Result Value Ref Range   WBC 5.3 4.0 - 10.5 K/uL   RBC 3.96 3.87 - 5.11 MIL/uL   Hemoglobin 9.8 (L) 12.0 - 15.0 g/dL   HCT 66.2 (L) 63.9 - 53.9 %   MCV 85.1 80.0 - 100.0 fL   MCH 24.7 (L) 26.0 - 34.0 pg   MCHC 29.1 (L) 30.0 - 36.0 g/dL   RDW 80.3 (H) 88.4 - 84.4 %   Platelets 300 150 - 400 K/uL   nRBC 0.0 0.0 - 0.2 %    Comment: Performed at [facility], [address], [place], [place] [address]  Lactic acid, plasma     Status: None   Collection Time: 10/24/23  2:47 AM  Result Value Ref Range   Lactic Acid, Venous 0.6 0.5 - 1.9 mmol/L    Comment: Performed at [facility], [address] [place], [place] [address]    US  Abdomen Limited Result Date: 10/23/2023 CLINICAL DATA:  Right upper quadrant pain. EXAM: ULTRASOUND ABDOMEN LIMITED RIGHT UPPER QUADRANT COMPARISON:  None Available. FINDINGS: Gallbladder: The gallbladder is contracted and subsequently limited in evaluation. Multiple gallstones are seen within the gallbladder lumen. The largest measures approximately 9.7 mm. Gallbladder wall measures 5.1 mm in thickness. No sonographic Murphy sign noted by sonographer. Common bile duct: Diameter: 15.2  mm (measured 10 mm on the prior study) Liver: No focal lesion identified. Intrahepatic biliary dilatation is seen. Diffusely increased echogenicity of the liver parenchyma is noted. Portal vein is patent on color Doppler imaging with normal direction of blood flow towards the liver. Other: None. IMPRESSION: 1. Contracted gallbladder and subsequently limited study. 2. Cholelithiasis. 3. Gallbladder wall thickening which is likely, in part, secondary to the contracted nature of the gallbladder. 4. Dilated common bile duct with intrahepatic biliary dilatation. Further evaluation with nonemergent MRCP is recommended. 5. Hepatic steatosis. Electronically Signed   By: Suzen Dials M.D.   On: 10/23/2023 20:23    Review of Systems  Constitutional: Negative.   HENT: Negative.    Eyes: Negative.   Respiratory: Negative.    Gastrointestinal:  Positive for abdominal pain and nausea.  Musculoskeletal: Negative.   Neurological: Negative.   Hematological: Negative.   Psychiatric/Behavioral: Negative.    All other systems reviewed and are negative.   Blood pressure (!) 130/97, pulse 67, temperature 98.6 F (37 C), temperature source Oral, resp. rate 16, height 5' 3 (1.6 m), weight (!) 155.3 kg, SpO2 96%. Physical Exam Vitals reviewed.  Constitutional:      General: She is not in acute distress.  Appearance: She is well-developed. She is obese. She is not ill-appearing.  HENT:     Head: Normocephalic and atraumatic.     Mouth/Throat:     Mouth: Mucous membranes are moist.  Eyes:     General: No scleral icterus.    Extraocular Movements: Extraocular movements intact.     Pupils: Pupils are equal, round, and reactive to light.  Cardiovascular:     Rate and Rhythm: Normal rate and regular rhythm.     Heart sounds: Normal heart sounds. No murmur heard. Pulmonary:     Effort: No respiratory distress.     Breath sounds: Normal breath sounds. No stridor. No wheezing, rhonchi or rales.   Abdominal:     General: Abdomen is protuberant. A surgical scar is present. Bowel sounds are decreased. There is no distension or abdominal bruit. There are no signs of injury.     Palpations: Abdomen is soft. There is no shifting dullness, fluid wave, hepatomegaly, splenomegaly or mass.     Tenderness: There is no abdominal tenderness. There is no guarding or rebound. Negative signs include Murphy's sign and McBurney's sign.     Hernia: No hernia is present.  Skin:    General: Skin is warm and dry.     Capillary Refill: Capillary refill takes 2 to 3 seconds.     Coloration: Skin is not cyanotic, jaundiced, mottled or pale.     Findings: No erythema.  Neurological:     General: No focal deficit present.     Mental Status: She is alert and oriented to person, place, and time.  Psychiatric:        Mood and Affect: Mood normal. Mood is not anxious or depressed.        Behavior: Behavior normal.      Assessment/Plan: Cholelithiasis Choledocholithiasis Obesity with BMI 60 Hepatic steatosis Elevated LFTs Iron deficiency anemia Prediabetes  ERCP planned tomorrow with Dr. Abran Will need lap chole after that. Briefly discussed surgery and risks as well as post op restrictions and expectations of recovery.  BMI will complicate surgery a bit as well as steatosis of liver.  Will communicate with LDOW.    No evidence of cholecystitis   Jina Nephew 10/24/2023, 10:33 AM

## 2023-10-24 NOTE — Consult Note (Addendum)
 Consultation  Referring Provider:      Primary Care Physician:  Knute Thersia Bitters, FNP Primary Gastroenterologist:         Reason for Consultation:                 HPI:   Cheryl [name] is a 33 y.o. female with gallstones who presents with worsening abdominal pain.  Right upper quadrant abdominal pain - Onset approximately one week ago, beginning October 16, 2023, on and off symptoms for past 2-3 months.  - Initially mild, with significant worsening on October 23, 2023 while at work - Pain primarily localized to the right side of the abdomen, radiating to the back - Pain exacerbated by eating - Associated with reduced oral intake over the past few weeks  Gastrointestinal symptoms - Mild nausea without vomiting - Change in bowel habits with diarrhea occurring once or twice daily - Stools less formed than usual  Urinary changes - Darker urine observed  Constitutional symptoms - No fevers or chills  Patient was seen by Lauraine Pa in GI office on July 21 for right upper quadrant abdominal pain, she has longstanding history of gallstones, right upper quadrant ultrasound August 06, 2023 showed gallstones with dilated CBD to 10 mm, no intrahepatic duct dilation.  MRI MRCP October 02, 2023 showed gallstones, choledocholithiasis with dilated CBD to 10 mm with no intrahepatic duct dilation.  Presented to ER yesterday with worsening abdominal pain, right upper quadrant ultrasound showed contracted gallbladder with cholelithiasis, gallbladder wall thickening, dilated CBD and intrahepatic biliary dilation  Past Medical History:  Diagnosis Date   Allergy 2018   Anemia    Anxiety    Depression    Gall stones     History reviewed. No pertinent surgical history.  Family History  Problem Relation Age of Onset   Depression Mother    Diabetes Mother    Obesity Mother    Arthritis Mother    Diabetes Maternal Grandmother    Heart disease Maternal Grandmother    Obesity Maternal  Grandmother    Arthritis Maternal Grandmother    Stroke Maternal Grandfather    Liver disease Neg Hx    Esophageal cancer Neg Hx    Colon cancer Neg Hx      Social History   Tobacco Use   Smoking status: Never   Smokeless tobacco: Never  Vaping Use   Vaping status: Never Used  Substance Use Topics   Alcohol use: No   Drug use: No    Prior to Admission medications   Medication Sig Start Date End Date Taking? Authorizing Provider  acetaminophen  (TYLENOL ) 500 MG tablet Take 1,000 mg by mouth 2 (two) times daily as needed for moderate pain (pain score 4-6) or headache.   Yes [provider]  ibuprofen  (ADVIL ) 200 MG tablet Take 400 mg by mouth 2 (two) times daily as needed for moderate pain (pain score 4-6) or headache.   Yes [provider]    Current Facility-Administered Medications  Medication Dose Route Frequency Provider Last Rate Last Admin   0.9 %  sodium chloride  infusion   Intravenous Continuous [place] Rake, MD 75 mL/hr at 10/23/23 2345 Restarted at 10/23/23 2345   metoprolol  tartrate (LOPRESSOR ) injection 5 mg  5 mg Intravenous Q6H PRN Waddell Rake, MD       morphine  (PF) 2 MG/ML injection 2 mg  2 mg Intravenous Q2H PRN Waddell Rake, MD       ondansetron  (ZOFRAN ) tablet 4  mg  4 mg Oral Q6H PRN Waddell Rake, MD       Or   ondansetron  (ZOFRAN ) injection 4 mg  4 mg Intravenous Q6H PRN Waddell Rake, MD       traMADol  (ULTRAM ) tablet 50 mg  50 mg Oral Q8H PRN Waddell Rake, MD        Allergies as of 10/23/2023 - Review Complete 10/23/2023  Allergen Reaction Noted   Shellfish allergy Shortness Of Breath 12/27/2019   Latex Itching and Rash 06/06/2013     Review of Systems:    This is positive for those things mentioned in the HPI. All other review of systems are negative.       Physical Exam:  Vital signs in last 24 hours: Temp:  [98.2 F (36.8 C)-98.7 F (37.1 C)] 98.6 F (37 C) (08/24 0850) Pulse Rate:  [67-100] 67 (08/24  0850) Resp:  [16-20] 16 (08/24 0850) BP: (119-155)/(68-102) 130/97 (08/24 0850) SpO2:  [94 %-100 %] 96 % (08/24 0850) Weight:  [155.3 kg] 155.3 kg (08/24 0156) Last BM Date : 10/22/23  GENERAL: Alert, cooperative, well developed, no acute distress, morbidly obese HEENT: Normocephalic, normal oropharynx, moist mucous membranes CHEST: Clear to auscultation bilaterally, no wheezes, rhonchi, or crackles CARDIOVASCULAR: Normal heart rate and rhythm, S1 and S2 normal without murmurs ABDOMEN: Soft, non-distended, without organomegaly, normal bowel sounds, right side abdominal tenderness EXTREMITIES: No cyanosis or edema NEUROLOGICAL: Cranial nerves grossly intact, moves all extremities without gross motor or sensory deficits   Data Reviewed:   LAB RESULTS: Recent Labs    10/23/23 1929 10/24/23 0247  WBC 6.1 5.3  HGB 10.5* 9.8*  HCT 36.0 33.7*  PLT 363 300   BMET Recent Labs    10/23/23 1929 10/24/23 0247  NA 140 139  K 4.3 3.9  CL 105 105  CO2 26 24  GLUCOSE 89 91  BUN 14 11  CREATININE 0.89 0.76  CALCIUM 9.0 8.5*   LFT Recent Labs    10/24/23 0247  PROT 6.5  ALBUMIN 3.1*  AST 452*  ALT 254*  ALKPHOS 288*  BILITOT 0.7   PT/INR Recent Labs    10/23/23 2212  LABPROT 13.2  INR 0.9    STUDIES: US  Abdomen Limited Result Date: 10/23/2023 CLINICAL DATA:  Right upper quadrant pain. EXAM: ULTRASOUND ABDOMEN LIMITED RIGHT UPPER QUADRANT COMPARISON:  None Available. FINDINGS: Gallbladder: The gallbladder is contracted and subsequently limited in evaluation. Multiple gallstones are seen within the gallbladder lumen. The largest measures approximately 9.7 mm. Gallbladder wall measures 5.1 mm in thickness. No sonographic Murphy sign noted by sonographer. Common bile duct: Diameter: 15.2 mm (measured 10 mm on the prior study) Liver: No focal lesion identified. Intrahepatic biliary dilatation is seen. Diffusely increased echogenicity of the liver parenchyma is noted. Portal  vein is patent on color Doppler imaging with normal direction of blood flow towards the liver. Other: None. IMPRESSION: 1. Contracted gallbladder and subsequently limited study. 2. Cholelithiasis. 3. Gallbladder wall thickening which is likely, in part, secondary to the contracted nature of the gallbladder. 4. Dilated common bile duct with intrahepatic biliary dilatation. Further evaluation with nonemergent MRCP is recommended. 5. Hepatic steatosis. Electronically Signed   By: Suzen Dials M.D.   On: 10/23/2023 20:23     PREVIOUS ENDOSCOPIES:            None    Impression / Plan:  33 year old very pleasant female with morbid obesity with worsening right upper quadrant abdominal pain Gallstones with possible choledocholithiasis Gallstones  in the gallbladder with potential bile duct stones causing significant pain and gallbladder irritation.  Elevated LFT: Alk phos and transaminases trending up in the setting of choledocholithiasis  No fever or elevated leukocytosis, no signs of cholangitis  Will DC repeat MRCP, will be not beneficial or add any additional information given findings of intrahepatic dilation on right upper quadrant ultrasound done yesterday which was not evident on previous exam and confirmed choledocholithiasis on recent MRCP on August 2 Plan for ERCP tomorrow at 1 PM with Dr. Abran, discussed case with him Clear liquid diet N.p.o. after midnight - Coordinate with surgeons for  cholecystectomy.  Patient is at increased/high risk for anesthesia and procedure related complications due to high BMI>60 The risks and benefits as well as alternatives of endoscopic procedure(s) have been discussed and reviewed. All questions answered. The patient agrees to proceed.   Abdominal pain, right side and back Severe pain localized to the right side and back, likely due to gallstones and possible choledocholithiasis, exacerbated by eating, leading to reduced oral intake. - Administer  pain medication as needed.  Diarrhea Diarrhea occurring once or twice daily with less formed stools.  Nausea Slight nausea, particularly while in the hospital. Zofran  4 mg every 8 hours as needed   The patient was provided an opportunity to ask questions and all were answered. The patient agreed with the plan and demonstrated an understanding of the instructions.    LOIS Wilkie Mcgee , MD [phone]

## 2023-10-24 NOTE — Plan of Care (Signed)
   Problem: Coping: Goal: Level of anxiety will decrease Outcome: Progressing   Problem: Pain Managment: Goal: General experience of comfort will improve and/or be controlled Outcome: Progressing

## 2023-10-25 ENCOUNTER — Observation Stay (HOSPITAL_COMMUNITY)

## 2023-10-25 ENCOUNTER — Encounter (HOSPITAL_COMMUNITY)
Admission: EM | Disposition: A | Discharge status: Home / Self Care | Payer: Self-pay | Source: Home / Self Care | Attending: Emergency Medicine

## 2023-10-25 ENCOUNTER — Encounter (HOSPITAL_COMMUNITY): Payer: Self-pay | Admitting: Family Medicine

## 2023-10-25 ENCOUNTER — Ambulatory Visit (HOSPITAL_BASED_OUTPATIENT_CLINIC_OR_DEPARTMENT_OTHER): Payer: Self-pay | Admitting: Certified Registered Nurse Anesthetist

## 2023-10-25 ENCOUNTER — Encounter (HOSPITAL_COMMUNITY): Payer: Self-pay | Admitting: Certified Registered Nurse Anesthetist

## 2023-10-25 ENCOUNTER — Inpatient Hospital Stay (HOSPITAL_COMMUNITY)

## 2023-10-25 DIAGNOSIS — K8050 Calculus of bile duct without cholangitis or cholecystitis without obstruction: Secondary | ICD-10-CM | POA: Diagnosis not present

## 2023-10-25 DIAGNOSIS — Z6841 Body Mass Index (BMI) 40.0 and over, adult: Secondary | ICD-10-CM

## 2023-10-25 DIAGNOSIS — R079 Chest pain, unspecified: Secondary | ICD-10-CM | POA: Diagnosis not present

## 2023-10-25 DIAGNOSIS — K838 Other specified diseases of biliary tract: Secondary | ICD-10-CM

## 2023-10-25 DIAGNOSIS — K831 Obstruction of bile duct: Secondary | ICD-10-CM | POA: Diagnosis not present

## 2023-10-25 DIAGNOSIS — E6689 Other obesity not elsewhere classified: Secondary | ICD-10-CM | POA: Diagnosis not present

## 2023-10-25 LAB — CBC
HCT: 32.6 % — ABNORMAL LOW (ref 36.0–46.0)
Hemoglobin: 9.8 g/dL — ABNORMAL LOW (ref 12.0–15.0)
MCH: 25.6 pg — ABNORMAL LOW (ref 26.0–34.0)
MCHC: 30.1 g/dL (ref 30.0–36.0)
MCV: 85.1 fL (ref 80.0–100.0)
Platelets: 330 K/uL (ref 150–400)
RBC: 3.83 MIL/uL — ABNORMAL LOW (ref 3.87–5.11)
RDW: 19.4 % — ABNORMAL HIGH (ref 11.5–15.5)
WBC: 5.5 K/uL (ref 4.0–10.5)
nRBC: 0 % (ref 0.0–0.2)

## 2023-10-25 LAB — COMPREHENSIVE METABOLIC PANEL WITH GFR
ALT: 151 U/L — ABNORMAL HIGH (ref 0–44)
AST: 72 U/L — ABNORMAL HIGH (ref 15–41)
Albumin: 3 g/dL — ABNORMAL LOW (ref 3.5–5.0)
Alkaline Phosphatase: 215 U/L — ABNORMAL HIGH (ref 38–126)
Anion gap: 6 (ref 5–15)
BUN: 9 mg/dL (ref 6–20)
CO2: 23 mmol/L (ref 22–32)
Calcium: 8.2 mg/dL — ABNORMAL LOW (ref 8.9–10.3)
Chloride: 108 mmol/L (ref 98–111)
Creatinine, Ser: 0.84 mg/dL (ref 0.44–1.00)
GFR, Estimated: >60 mL/min (ref >60)
Glucose, Bld: 76 mg/dL (ref 70–99)
Potassium: 3.5 mmol/L (ref 3.5–5.1)
Sodium: 137 mmol/L (ref 135–145)
Total Bilirubin: 0.4 mg/dL (ref 0.0–1.2)
Total Protein: 6.2 g/dL — ABNORMAL LOW (ref 6.5–8.1)

## 2023-10-25 SURGERY — ERCP, WITH INTERVENTION IF INDICATED
Anesthesia: General

## 2023-10-25 MED ORDER — CIPROFLOXACIN IN D5W 400 MG/200ML IV SOLN
INTRAVENOUS | Status: AC
  Filled 2023-10-25: qty 200

## 2023-10-25 MED ORDER — PROPOFOL 10 MG/ML IV BOLUS
INTRAVENOUS | Status: DC | PRN
  Administered 2023-10-25: 200 mg via INTRAVENOUS

## 2023-10-25 MED ORDER — SODIUM CHLORIDE 0.9 % IV SOLN
INTRAVENOUS | Status: DC | PRN
  Administered 2023-10-25: 135 mL

## 2023-10-25 MED ORDER — GLUCAGON HCL RDNA (DIAGNOSTIC) 1 MG IJ SOLR
INTRAMUSCULAR | Status: AC
  Filled 2023-10-25: qty 2

## 2023-10-25 MED ORDER — DICLOFENAC SUPPOSITORY 100 MG
RECTAL | Status: AC
  Filled 2023-10-25: qty 1

## 2023-10-25 MED ORDER — SODIUM CHLORIDE 0.9 % IV SOLN
2.0000 g | INTRAVENOUS | Status: AC
  Administered 2023-10-26: 1 g via INTRAVENOUS
  Filled 2023-10-25: qty 20

## 2023-10-25 MED ORDER — ROCURONIUM BROMIDE 10 MG/ML (PF) SYRINGE
PREFILLED_SYRINGE | INTRAVENOUS | Status: DC | PRN
  Administered 2023-10-25: 70 mg via INTRAVENOUS

## 2023-10-25 MED ORDER — SODIUM CHLORIDE 0.9 % IV SOLN: INTRAVENOUS | Status: DC

## 2023-10-25 MED ORDER — LACTATED RINGERS IV SOLN
INTRAVENOUS | Status: AC | PRN
  Administered 2023-10-25: 20 mL/h via INTRAVENOUS

## 2023-10-25 MED ORDER — FENTANYL CITRATE (PF) 100 MCG/2ML IJ SOLN
INTRAMUSCULAR | Status: AC
  Filled 2023-10-25: qty 2

## 2023-10-25 MED ORDER — FENTANYL CITRATE (PF) 100 MCG/2ML IJ SOLN
INTRAMUSCULAR | Status: DC | PRN
  Administered 2023-10-25 (×2): 50 ug via INTRAVENOUS

## 2023-10-25 MED ORDER — MIDAZOLAM HCL 5 MG/5ML IJ SOLN
INTRAMUSCULAR | Status: DC | PRN
  Administered 2023-10-25: 2 mg via INTRAVENOUS

## 2023-10-25 MED ORDER — DEXAMETHASONE SODIUM PHOSPHATE 4 MG/ML IJ SOLN
INTRAMUSCULAR | Status: DC | PRN
  Administered 2023-10-25: 5 mg via INTRAVENOUS

## 2023-10-25 MED ORDER — MIDAZOLAM HCL 2 MG/2ML IJ SOLN
INTRAMUSCULAR | Status: AC
Start: 2023-10-25 — End: 2023-10-25
  Filled 2023-10-25: qty 2

## 2023-10-25 MED ORDER — ONDANSETRON HCL 4 MG/2ML IJ SOLN
INTRAMUSCULAR | Status: DC | PRN
  Administered 2023-10-25: 4 mg via INTRAVENOUS

## 2023-10-25 MED ORDER — CIPROFLOXACIN IN D5W 400 MG/200ML IV SOLN
INTRAVENOUS | Status: DC | PRN
  Administered 2023-10-25: 400 mg via INTRAVENOUS

## 2023-10-25 MED ORDER — PROPOFOL 10 MG/ML IV BOLUS
INTRAVENOUS | Status: AC
  Filled 2023-10-25: qty 20

## 2023-10-25 MED ORDER — SUGAMMADEX SODIUM 200 MG/2ML IV SOLN
INTRAVENOUS | Status: DC | PRN
  Administered 2023-10-25: 400 mg via INTRAVENOUS

## 2023-10-25 MED ORDER — DICLOFENAC SUPPOSITORY 100 MG
RECTAL | Status: DC | PRN
  Administered 2023-10-25: 100 mg via RECTAL

## 2023-10-25 NOTE — Progress Notes (Signed)
 Subjective: No complaints today.  Pain is about a 2.  Awaiting ERCP  ROS: See above, otherwise other systems negative  Objective: Vital signs in last 24 hours: Temp:  [98 F (36.7 C)-98.6 F (37 C)] 98.6 F (37 C) (08/25 0615) Pulse Rate:  [57-75] 66 (08/25 0615) Resp:  [16-18] 17 (08/25 0615) BP: (128-156)/(75-97) 131/78 (08/25 0615) SpO2:  [93 %-100 %] 96 % (08/25 0615) Last BM Date : 10/22/23  Intake/Output from previous day: 08/24 0701 - 08/25 0700 In: 900 [P.O.:900] Out: 0  Intake/Output this shift: No intake/output data recorded.  PE: Gen: NAD, morbidly obese, lying in bed Abd: soft, obese, minimally tender in RUQ, otherwise benign.  No masses, hernias.  Lab Results:  Recent Labs    10/24/23 0247 10/25/23 0517  WBC 5.3 5.5  HGB 9.8* 9.8*  HCT 33.7* 32.6*  PLT 300 330   BMET Recent Labs    10/24/23 0247 10/25/23 0501  NA 139 137  K 3.9 3.5  CL 105 108  CO2 24 23  GLUCOSE 91 76  BUN 11 9  CREATININE 0.76 0.84  CALCIUM 8.5* 8.2*   PT/INR Recent Labs    10/23/23 2212  LABPROT 13.2  INR 0.9   CMP     Component Value Date/Time   NA 137 10/25/2023 0501   NA 140 03/18/2023 0953   K 3.5 10/25/2023 0501   CL 108 10/25/2023 0501   CO2 23 10/25/2023 0501   GLUCOSE 76 10/25/2023 0501   BUN 9 10/25/2023 0501   BUN 9 03/18/2023 0953   CREATININE 0.84 10/25/2023 0501   CALCIUM 8.2 (L) 10/25/2023 0501   PROT 6.2 (L) 10/25/2023 0501   PROT 7.0 03/18/2023 0953   ALBUMIN 3.0 (L) 10/25/2023 0501   ALBUMIN 4.0 03/18/2023 0953   AST 72 (H) 10/25/2023 0501   ALT 151 (H) 10/25/2023 0501   ALKPHOS 215 (H) 10/25/2023 0501   BILITOT 0.4 10/25/2023 0501   BILITOT <0.2 03/18/2023 0953   GFRNONAA >60 10/25/2023 0501   GFRAA >60 04/24/2018 1108   Lipase     Component Value Date/Time   LIPASE 33 10/23/2023 1929       Studies/Results: US  Abdomen Limited Result Date: 10/23/2023 CLINICAL DATA:  Right upper quadrant pain. EXAM: ULTRASOUND  ABDOMEN LIMITED RIGHT UPPER QUADRANT COMPARISON:  None Available. FINDINGS: Gallbladder: The gallbladder is contracted and subsequently limited in evaluation. Multiple gallstones are seen within the gallbladder lumen. The largest measures approximately 9.7 mm. Gallbladder wall measures 5.1 mm in thickness. No sonographic Murphy sign noted by sonographer. Common bile duct: Diameter: 15.2 mm (measured 10 mm on the prior study) Liver: No focal lesion identified. Intrahepatic biliary dilatation is seen. Diffusely increased echogenicity of the liver parenchyma is noted. Portal vein is patent on color Doppler imaging with normal direction of blood flow towards the liver. Other: None. IMPRESSION: 1. Contracted gallbladder and subsequently limited study. 2. Cholelithiasis. 3. Gallbladder wall thickening which is likely, in part, secondary to the contracted nature of the gallbladder. 4. Dilated common bile duct with intrahepatic biliary dilatation. Further evaluation with nonemergent MRCP is recommended. 5. Hepatic steatosis. Electronically Signed   By: Suzen Dials M.D.   On: 10/23/2023 20:23    Anti-infectives: Anti-infectives (From admission, onward)    Start     Dose/Rate Route Frequency Ordered Stop   10/25/23 0600  ampicillin -sulbactam (UNASYN ) 1.5 g in sodium chloride  0.9 % 100 mL IVPB  1.5 g 200 mL/hr over 30 Minutes Intravenous On call to O.R. 10/24/23 2103 10/26/23 0559        Assessment/Plan Cholelithiasis, choledocholithiasis -ERCP pending today by GI -will plan for NPO p MN and if no post ERCP complications can look forward to lap chole tomorrow in the OR -labs reviewed and are relatively unremarkable at this time -patient agreeable with this plan.   FEN - NPO for ERCP, NPO p MN VTE - SCDs ID - unasyn  pre-ERCP  Morbid obesity, BMI 60.65 Anxiety/depression  I reviewed Consultant GI notes, hospitalist notes, last 24 h vitals and pain scores, last 48 h intake and output, last  24 h labs and trends, and last 24 h imaging results.   LOS: 2 days    Burnard FORBES Banter , [facility] 10/25/2023, 8:41 AM Please see Amion for pager number during day hours 7:00am-4:30pm or 7:00am -11:30am on weekends

## 2023-10-25 NOTE — Plan of Care (Signed)
  Problem: Pain Managment: Goal: General experience of comfort will improve and/or be controlled Outcome: Progressing

## 2023-10-25 NOTE — Anesthesia Preprocedure Evaluation (Signed)
 Anesthesia Evaluation  Patient identified by MRN, date of birth, ID band Patient awake    Reviewed: Allergy & Precautions, NPO status , Patient's Chart, lab work & pertinent test results  Airway Mallampati: III  TM Distance: >3 FB Neck ROM: Full    Dental  (+) Dental Advisory Given   Pulmonary neg pulmonary ROS   breath sounds clear to auscultation       Cardiovascular negative cardio ROS  Rhythm:Regular Rate:Normal     Neuro/Psych negative neurological ROS     GI/Hepatic negative GI ROS, Neg liver ROS,,,  Endo/Other    Class 4 obesity  Renal/GU negative Renal ROS     Musculoskeletal   Abdominal   Peds  Hematology  (+) Blood dyscrasia, anemia   Anesthesia Other Findings   Reproductive/Obstetrics                              Anesthesia Physical Anesthesia Plan  ASA: 3  Anesthesia Plan: General   Post-op Pain Management: Minimal or no pain anticipated   Induction: Intravenous  PONV Risk Score and Plan: 3 and Dexamethasone , Ondansetron , Midazolam  and Treatment may vary due to age or medical condition  Airway Management Planned: Oral ETT  Additional Equipment:   Intra-op Plan:   Post-operative Plan: Extubation in OR  Informed Consent: I have reviewed the patients History and Physical, chart, labs and discussed the procedure including the risks, benefits and alternatives for the proposed anesthesia with the patient or authorized representative who has indicated his/her understanding and acceptance.     Dental advisory given  Plan Discussed with: CRNA  Anesthesia Plan Comments:         Anesthesia Quick Evaluation

## 2023-10-25 NOTE — Progress Notes (Addendum)
 [facility] Progress Note  CC: RUQ pain, cholelithiasis  Subjective: She denies having any nausea or vomiting.  She continues to have RUQ pain which is well-controlled at this time.  She endorses having mild right sided chest pain which is slightly worse with inspiration but started last night.  No cough or shortness of breath.  She remains NPO for ERCP today.    Objective:  Vital signs in last 24 hours: Temp:  [98 F (36.7 C)-98.6 F (37 C)] 98.6 F (37 C) (08/25 0615) Pulse Rate:  [57-75] 66 (08/25 0615) Resp:  [16-18] 17 (08/25 0615) BP: (128-156)/(75-97) 131/78 (08/25 0615) SpO2:  [93 %-100 %] 96 % (08/25 0615) Last BM Date : 10/22/23 General: Alert 33 year old female in no acute distress Eyes: No scleral icterus. Heart: Regular rate and rhythm, no murmurs. Pulm: Breath sounds clear throughout.  On room air. Abdomen: Obese abdomen, soft.  Nondistended.  Mild epigastric and RUQ tenderness without rebound or guarding.  Positive bowel sounds to all 4 quadrants.  No palpable masses. Extremities: No edema. Neurologic:  Alert and oriented x 4. Grossly normal neurologically. Psych:  Alert and cooperative. Normal mood and affect.  Intake/Output from previous day: 08/24 0701 - 08/25 0700 In: 900 [P.O.:900] Out: 0  Intake/Output this shift: No intake/output data recorded.  Lab Results: Recent Labs    10/23/23 1929 10/24/23 0247 10/25/23 0517  WBC 6.1 5.3 5.5  HGB 10.5* 9.8* 9.8*  HCT 36.0 33.7* 32.6*  PLT 363 300 330   BMET Recent Labs    10/23/23 1929 10/24/23 0247 10/25/23 0501  NA 140 139 137  K 4.3 3.9 3.5  CL 105 105 108  CO2 26 24 23   GLUCOSE 89 91 76  BUN 14 11 9   CREATININE 0.89 0.76 0.84  CALCIUM 9.0 8.5* 8.2*   LFT Recent Labs    10/25/23 0501  PROT 6.2*  ALBUMIN 3.0*  AST 72*  ALT 151*  ALKPHOS 215*  BILITOT 0.4   PT/INR Recent Labs    10/23/23 2212  LABPROT 13.2  INR 0.9   Hepatitis Panel No results for  input(s): HEPBSAG, HCVAB, HEPAIGM, HEPBIGM in the last 72 hours.  US  Abdomen Limited Result Date: 10/23/2023 CLINICAL DATA:  Right upper quadrant pain. EXAM: ULTRASOUND ABDOMEN LIMITED RIGHT UPPER QUADRANT COMPARISON:  None Available. FINDINGS: Gallbladder: The gallbladder is contracted and subsequently limited in evaluation. Multiple gallstones are seen within the gallbladder lumen. The largest measures approximately 9.7 mm. Gallbladder wall measures 5.1 mm in thickness. No sonographic Murphy sign noted by sonographer. Common bile duct: Diameter: 15.2 mm (measured 10 mm on the prior study) Liver: No focal lesion identified. Intrahepatic biliary dilatation is seen. Diffusely increased echogenicity of the liver parenchyma is noted. Portal vein is patent on color Doppler imaging with normal direction of blood flow towards the liver. Other: None. IMPRESSION: 1. Contracted gallbladder and subsequently limited study. 2. Cholelithiasis. 3. Gallbladder wall thickening which is likely, in part, secondary to the contracted nature of the gallbladder. 4. Dilated common bile duct with intrahepatic biliary dilatation. Further evaluation with nonemergent MRCP is recommended. 5. Hepatic steatosis. Electronically Signed   By: Suzen Dials M.D.   On: 10/23/2023 20:23    Assessment / Plan:  33 year old female with a history of gallstones with recurrent RUQ pain. Prior outpatient MRI/MRCP 10/02/2023 identified cholelithiasis, choledocholithiasis and CBD dilatation with plans for an ERCP 9/15. Admitted 10/23/2023 with worsening RUQ pain and elevated LFTs. Alk  phos 264 ->  288 -> 215. AST 151 -> 452 -> 72. ALT 108 -> 254 -> 151. Normal T. Bili.  No leukocytosis. RUQ sono 8/23 showed cholelithiasis with CBD and intrahepatic biliary dilatation. Patient endorses having right chest pain which started last night, likely secondary to choledocholithiasis with low suspicion of cardiac or pulmonary etiology.  Hemodynamically  stable. -I discussed patient's c/o right-sided chest pain with Dr.Adhikari, chest pain likely due to choledocholithiasis as noted above, will check EKG and chest x-ray for reassurance prior to ERCP - NPO - Proceed with ERCP with Dr. Abran this afternoon. benefits and risks discussed including risk with sedation, risk of bleeding, perforation, pancreatitis and infection  - NS IV @ 75 cc/hr  - Ondansetron  4 mg PO or IV every 6 hours as needed - Pain management per the hospitalist - General Surgery planning on laparoscopic cholecystectomy after ERCP completed, timing to be determined  Hepatic steatosis -Recommend outpatient follow up  Chronic IDA, intermittent menorrhagia.  No overt GI bleeding. Hg 10.5 -> 9.8.   Principal Problem:   Choledocholithiasis Active Problems:   Prediabetes   Iron deficiency anemia due to chronic blood loss   Elevated blood pressure reading     LOS: 2 days   Colleen M Kennedy-Smith  10/25/2023, 8:48 AM  GI BILIARY ATTENDING  I was contacted by the inpatient GI team regarding symptomatic choledocholithiasis with abnormal liver tests and the need for ERCP.  I have reviewed her outpatient and inpatient record as well as laboratories and x-rays.  Symptomatic choledocholithiasis.  She was scheduled for outpatient ERCP with Dr. Wilhelmenia in a few weeks, but presented to the hospital in the interim with worsening pain.  Agree with plans for ERCP with sphincterotomy and stone extraction.  She is HIGH RISK given her marked morbid obesity with a BMI of greater than 60.The nature of the procedure, as well as the risks, benefits, and alternatives were carefully and thoroughly reviewed with the patient. Ample time for discussion and questions allowed. The patient understood, was satisfied, and agreed to proceed.  Norleen SAILOR. Abran Raddle., M.D. [facility] Division of Gastroenterology

## 2023-10-25 NOTE — H&P (View-Only) (Signed)
 Subjective: No complaints today.  Pain is about a 2.  Awaiting ERCP  ROS: See above, otherwise other systems negative  Objective: Vital signs in last 24 hours: Temp:  [98 F (36.7 C)-98.6 F (37 C)] 98.6 F (37 C) (08/25 0615) Pulse Rate:  [57-75] 66 (08/25 0615) Resp:  [16-18] 17 (08/25 0615) BP: (128-156)/(75-97) 131/78 (08/25 0615) SpO2:  [93 %-100 %] 96 % (08/25 0615) Last BM Date : 10/22/23  Intake/Output from previous day: 08/24 0701 - 08/25 0700 In: 900 [P.O.:900] Out: 0  Intake/Output this shift: No intake/output data recorded.  PE: Gen: NAD, morbidly obese, lying in bed Abd: soft, obese, minimally tender in RUQ, otherwise benign.  No masses, hernias.  Lab Results:  Recent Labs    10/24/23 0247 10/25/23 0517  WBC 5.3 5.5  HGB 9.8* 9.8*  HCT 33.7* 32.6*  PLT 300 330   BMET Recent Labs    10/24/23 0247 10/25/23 0501  NA 139 137  K 3.9 3.5  CL 105 108  CO2 24 23  GLUCOSE 91 76  BUN 11 9  CREATININE 0.76 0.84  CALCIUM 8.5* 8.2*   PT/INR Recent Labs    10/23/23 2212  LABPROT 13.2  INR 0.9   CMP     Component Value Date/Time   NA 137 10/25/2023 0501   NA 140 03/18/2023 0953   K 3.5 10/25/2023 0501   CL 108 10/25/2023 0501   CO2 23 10/25/2023 0501   GLUCOSE 76 10/25/2023 0501   BUN 9 10/25/2023 0501   BUN 9 03/18/2023 0953   CREATININE 0.84 10/25/2023 0501   CALCIUM 8.2 (L) 10/25/2023 0501   PROT 6.2 (L) 10/25/2023 0501   PROT 7.0 03/18/2023 0953   ALBUMIN 3.0 (L) 10/25/2023 0501   ALBUMIN 4.0 03/18/2023 0953   AST 72 (H) 10/25/2023 0501   ALT 151 (H) 10/25/2023 0501   ALKPHOS 215 (H) 10/25/2023 0501   BILITOT 0.4 10/25/2023 0501   BILITOT <0.2 03/18/2023 0953   GFRNONAA >60 10/25/2023 0501   GFRAA >60 04/24/2018 1108   Lipase     Component Value Date/Time   LIPASE 33 10/23/2023 1929       Studies/Results: US  Abdomen Limited Result Date: 10/23/2023 CLINICAL DATA:  Right upper quadrant pain. EXAM: ULTRASOUND  ABDOMEN LIMITED RIGHT UPPER QUADRANT COMPARISON:  None Available. FINDINGS: Gallbladder: The gallbladder is contracted and subsequently limited in evaluation. Multiple gallstones are seen within the gallbladder lumen. The largest measures approximately 9.7 mm. Gallbladder wall measures 5.1 mm in thickness. No sonographic Murphy sign noted by sonographer. Common bile duct: Diameter: 15.2 mm (measured 10 mm on the prior study) Liver: No focal lesion identified. Intrahepatic biliary dilatation is seen. Diffusely increased echogenicity of the liver parenchyma is noted. Portal vein is patent on color Doppler imaging with normal direction of blood flow towards the liver. Other: None. IMPRESSION: 1. Contracted gallbladder and subsequently limited study. 2. Cholelithiasis. 3. Gallbladder wall thickening which is likely, in part, secondary to the contracted nature of the gallbladder. 4. Dilated common bile duct with intrahepatic biliary dilatation. Further evaluation with nonemergent MRCP is recommended. 5. Hepatic steatosis. Electronically Signed   By: Suzen Dials M.D.   On: 10/23/2023 20:23    Anti-infectives: Anti-infectives (From admission, onward)    Start     Dose/Rate Route Frequency Ordered Stop   10/25/23 0600  ampicillin -sulbactam (UNASYN ) 1.5 g in sodium chloride  0.9 % 100 mL IVPB  1.5 g 200 mL/hr over 30 Minutes Intravenous On call to O.R. 10/24/23 2103 10/26/23 0559        Assessment/Plan Cholelithiasis, choledocholithiasis -ERCP pending today by GI -will plan for NPO p MN and if no post ERCP complications can look forward to lap chole tomorrow in the OR -labs reviewed and are relatively unremarkable at this time -patient agreeable with this plan.   FEN - NPO for ERCP, NPO p MN VTE - SCDs ID - unasyn  pre-ERCP  Morbid obesity, BMI 60.65 Anxiety/depression  I reviewed Consultant GI notes, hospitalist notes, last 24 h vitals and pain scores, last 48 h intake and output, last  24 h labs and trends, and last 24 h imaging results.   LOS: 2 days    Burnard FORBES Banter , [facility] 10/25/2023, 8:41 AM Please see Amion for pager number during day hours 7:00am-4:30pm or 7:00am -11:30am on weekends

## 2023-10-25 NOTE — Op Note (Signed)
 [facility] Patient Name: Cheryl [name] Procedure Date: 10/25/2023 MRN: [id number] Attending MD: Norleen SAILOR. Abran , MD, [id number] Date of Birth: 1990-05-26 CSN: [id number] Age: 33 Admit Type: Inpatient Procedure:                ERCP with biliary sphincterotomy and common duct                            stone extraction Indications:              Abdominal pain in the right upper quadrant, Bile                            duct stone on MRCP, Abnormal liver function test Providers:                Norleen SAILOR. Abran, MD, Ozell Pouch, Felice Sar,                            Technician Referring MD:             [facility] hospitalist Medicines:                General Anesthesia Complications:            No immediate complications. Estimated Blood Loss:     Estimated blood loss: none. Procedure:                Pre-Anesthesia Assessment:                           - Prior to the procedure, a History and Physical                            was performed, and patient medications and                            allergies were reviewed. The patient is competent.                            The risks and benefits of the procedure and the                            sedation options and risks were discussed with the                            patient. All questions were answered and informed                            consent was obtained. Patient identification and                            proposed procedure were verified by the physician.                            Mental Status Examination: alert and oriented.  Airway Examination: normal oropharyngeal airway and                            neck mobility. Respiratory Examination: clear to                            auscultation. CV Examination: normal. Prophylactic                            Antibiotics: The patient does not require                            prophylactic antibiotics. Prior Anticoagulants: The                             patient has taken no anticoagulant or antiplatelet                            agents. ASA Grade Assessment: III - A patient with                            severe systemic disease. After reviewing the risks                            and benefits, the patient was deemed in                            satisfactory condition to undergo the procedure.                            The anesthesia plan was to use moderate sedation /                            analgesia (conscious sedation). Immediately prior                            to administration of medications, the patient was                            re-assessed for adequacy to receive sedatives. The                            heart rate, respiratory rate, oxygen saturations,                            blood pressure, adequacy of pulmonary ventilation,                            and response to care were monitored throughout the                            procedure. The physical status of the patient was  re-assessed after the procedure.                           After obtaining informed consent, the scope was                            passed under direct vision. Throughout the                            procedure, the patient's blood pressure, pulse, and                            oxygen saturations were monitored continuously. The                            [id number]-[id number] ([id number]) Olympus duodenoscope was                            introduced through the mouth, and used to inject                            contrast into and used to cannulate the bile duct.                            The ERCP was accomplished without difficulty. The                            patient tolerated the procedure well. Scope In: Scope Out: Findings:      1. The esophagus was blindly intubated with the side-viewing endoscope.       The stomach, duodenum, and major ampulla were unremarkable. The minor       ampulla  was not sought.      2. Scout radiograph of the abdomen with the endoscope and position was       unremarkable      3. The bile duct was selectively cannulated on the first pass of the       catheter. Complete filling of the biliary tree revealed diffuse       dilation. Maximal extrahepatic bile duct diameter was approximately 15       mm. The cystic duct and gallbladder filled. The patient's body habitus       obscured the quality of the radiograph somewhat (grainy). Obvious       significant filling defects were not appreciated.      4. A biliary sphincterotomy was made by cutting over a hydrophilic       guidewire using the ERBE system, with cutting in the 12:00 orientation.       The sphincterotomy was deemed moderately large.      5. A 12-15 mm balloon was pulled through the bile duct multiple times.       Granular sludge extracted. Postextraction occlusion cholangiogram did       not reveal any obvious filling defects. The bile duct drainage post       sphincterotomy was excellent.      6. There was no injection or manipulation of the pancreatic duct, by       intent. Impression:  1. Status post ERC with sphincterotomy, duct stone                            extraction                           2. Cholelithiasis. Moderate Sedation:      none Recommendation:           - Observe the patient's clinical course. Okay for                            clear liquids today.                           - Trend liver tests                           - Laparoscopic cholecystectomy when appropriate.                            Surgery aware                           - GI inpatient team to follow. They are aware.                           The patient looks well in the post op area. I                            reviewed these findings. She was provided a copy of                            this report. Procedure Code(s):        --- Professional ---                           [phone],  Endoscopic retrograde                            cholangiopancreatography (ERCP); with removal of                            calculi/debris from biliary/pancreatic duct(s)                           43262, Endoscopic retrograde                            cholangiopancreatography (ERCP); with                            sphincterotomy/papillotomy Diagnosis Code(s):        --- Professional ---                           K83.1, Obstruction of bile duct  K80.50, Calculus of bile duct without cholangitis                            or cholecystitis without obstruction                           R10.11, Right upper quadrant pain                           R79.89, Other specified abnormal findings of blood                            chemistry CPT copyright 2022 [employer]. All rights reserved. The codes documented in this report are preliminary and upon coder review may  be revised to meet current compliance requirements. Norleen SAILOR. Abran, MD 10/25/2023 2:30:32 PM This report has been signed electronically. Number of Addenda: 0

## 2023-10-25 NOTE — Anesthesia Postprocedure Evaluation (Signed)
 Anesthesia Post Note  Patient: Cheryl [name]  Procedure(s) Performed: ERCP, WITH INTERVENTION IF INDICATED     Patient location during evaluation: PACU Anesthesia Type: General Level of consciousness: awake and alert Pain management: pain level controlled Vital Signs Assessment: post-procedure vital signs reviewed and stable Respiratory status: spontaneous breathing, nonlabored ventilation, respiratory function stable and patient connected to nasal cannula oxygen Cardiovascular status: blood pressure returned to baseline and stable Postop Assessment: no apparent nausea or vomiting Anesthetic complications: no   No notable events documented.  Last Vitals:  Vitals:   10/25/23 1430 10/25/23 1440  BP: (!) 156/93 (!) 156/82  Pulse: 65 61  Resp: 16 16  Temp:    SpO2: 100% 98%    Last Pain:  Vitals:   10/25/23 1440  TempSrc:   PainSc: 0-No pain                 Epifanio Lamar BRAVO

## 2023-10-25 NOTE — Anesthesia Preprocedure Evaluation (Addendum)
 Anesthesia Evaluation  Patient identified by MRN, date of birth, ID band Patient awake    Reviewed: Allergy & Precautions, NPO status , Patient's Chart, lab work & pertinent test results  Airway Mallampati: III  TM Distance: >3 FB Neck ROM: Full    Dental no notable dental hx. (+) Dental Advisory Given, Teeth Intact   Pulmonary neg pulmonary ROS   Pulmonary exam normal breath sounds clear to auscultation       Cardiovascular (-) hypertension(-) angina (-) Past MI negative cardio ROS Normal cardiovascular exam Rhythm:Regular Rate:Normal     Neuro/Psych  PSYCHIATRIC DISORDERS Anxiety Depression    negative neurological ROS     GI/Hepatic negative GI ROS, Neg liver ROS,,,  Endo/Other    Class 4 obesity  Renal/GU negative Renal ROSLab Results      Component                Value               Date                                 K                        3.5                 10/25/2023                 CREATININE               0.84                10/25/2023                GFRNONAA                 >60                 10/25/2023                CALCIUM                  8.2 (L)             10/25/2023                    Musculoskeletal   Abdominal   Peds  Hematology  (+) Blood dyscrasia, anemia Lab Results      Component                Value               Date                      WBC                      5.5                 10/25/2023                HGB                      9.8 (L)             10/25/2023                HCT  32.6 (L)            10/25/2023                MCV                      85.1                10/25/2023                PLT                      330                 10/25/2023              Anesthesia Other Findings All: latex  Reproductive/Obstetrics                              Anesthesia Physical Anesthesia Plan  ASA: 3  Anesthesia Plan: General    Post-op Pain Management: Tylenol  PO (pre-op)*, Toradol  IV (intra-op)* and Precedex    Induction: Intravenous  PONV Risk Score and Plan: 4 or greater and Dexamethasone , Ondansetron , Midazolam  and Treatment may vary due to age or medical condition  Airway Management Planned: Oral ETT  Additional Equipment:   Intra-op Plan:   Post-operative Plan: Extubation in OR  Informed Consent: I have reviewed the patients History and Physical, chart, labs and discussed the procedure including the risks, benefits and alternatives for the proposed anesthesia with the patient or authorized representative who has indicated his/her understanding and acceptance.     Dental advisory given  Plan Discussed with: CRNA and Surgeon  Anesthesia Plan Comments:          Anesthesia Quick Evaluation

## 2023-10-25 NOTE — Anesthesia Procedure Notes (Signed)
 Procedure Name: Intubation Date/Time: 10/25/2023 1:04 PM  Performed by: Judythe Tanda Aran, CRNAPre-anesthesia Checklist: Patient identified, Emergency Drugs available, Suction available and Patient being monitored Patient Re-evaluated:Patient Re-evaluated prior to induction Oxygen Delivery Method: Circle system utilized Preoxygenation: Pre-oxygenation with 100% oxygen Induction Type: IV induction Ventilation: Mask ventilation without difficulty Laryngoscope Size: 2 and Miller Grade View: Grade I Tube type: Oral Tube size: 7.5 mm Number of attempts: 1 Airway Equipment and Method: Stylet Placement Confirmation: ETT inserted through vocal cords under direct vision, positive ETCO2 and breath sounds checked- equal and bilateral Secured at: 22 cm Tube secured with: Tape Dental Injury: Teeth and Oropharynx as per pre-operative assessment

## 2023-10-25 NOTE — Progress Notes (Signed)
 PROGRESS NOTE  Cheryl [name]  FMW:[id number] DOB: 03-04-1990 DOA: 10/23/2023 PCP: Knute Thersia Bitters, FNP   Brief Narrative: Patient is a 33 year old female with history of iron deficiency anemia, prediabetes, cholelithiasis/choledocholithiasis who presented with complaint of abdominal pain mainly in the right upper quadrant, mild nausea.  No vomiting, fever or chills.  Her right upper quadrant USG back on 08/06/2023 showed dilation of CBD with no intrahepatic biliary duct dilatation or visualized choledocholithiasis.  MRCP on 8/25 showed cholelithiasis, choledocholithiasis with dilated common bile duct measuring up to 10 mm.  No signs of cholecystitis.  On presentation, she was hemodynamically stable.  Lab work showed AST of 151, ALT of 108.  Ultrasound of the abdomen showed contracted gallbladder, cholelithiasis, gallbladder wall thickening, dilated CBD with intrahepatic biliary dilatation.  GI consulted.    General surgery also consulted for consideration of cholecystectomy.  Plan for ERCP ,cholecystectomy  Assessment & Plan:  Principal Problem:   Choledocholithiasis Active Problems:   Elevated blood pressure reading   Iron deficiency anemia due to chronic blood loss   Prediabetes  Choledocholithiasis/cholelithiasis: Presented with right upper quadrant pain, elevated liver enzymes.  Imaging as above.  GI , surgery consultation obtained.  Liver enzymes have trended down No fever or chills or leukocytosis.  Complains of some right upper quadrant discomfort but no nausea or vomiting. Plan for ERCP, cholecystectomy  Right-sided chest pain: New problem.  This is most likely referred pain from right upper quadrant.  Will check chest x-ray and EKG  History of iron deficiency anemia: Due to menorrhagia.  Currently hemoglobin stable.  Prediabetes: A1c of 6.  We recommend diet modification, lifestyle changes  Morbid obesity: BMI of 60.6. We recommend diet modification, lifestyle  changes           DVT prophylaxis:SCDs Start: 10/23/23 2115     Code Status: Full Code  Family Communication: None at the bedside  Patient status:Inpatient  Patient is from :Home  Anticipated discharge un:Ynfz  Estimated DC date:1-2 days   Consultants: GI ,General Surgery  Procedures:None  Antimicrobials:  Anti-infectives (From admission, onward)    Start     Dose/Rate Route Frequency Ordered Stop   10/25/23 0600  ampicillin -sulbactam (UNASYN ) 1.5 g in sodium chloride  0.9 % 100 mL IVPB        1.5 g 200 mL/hr over 30 Minutes Intravenous On call to O.R. 10/24/23 2103 10/26/23 0559       Subjective: Patient seen and examined at bedside today.  Hemodynamically stable.  Overall comfortable.  Lying on bed.  Complains of some right chest discomfort that started last night.  No shortness of breath or cough.  Lungs are clear to auscultation.  Objective: Vitals:   10/24/23 1327 10/24/23 1739 10/24/23 2231 10/25/23 0615  BP: 137/89 (!) 156/95 128/75 131/78  Pulse: 75 72 (!) 57 66  Resp: 18 16 17 17   Temp: 98.3 F (36.8 C) 98 F (36.7 C) 98.5 F (36.9 C) 98.6 F (37 C)  TempSrc: Oral  Oral Oral  SpO2: 99% 93% 100% 96%  Weight:      Height:        Intake/Output Summary (Last 24 hours) at 10/25/2023 1007 Last data filed at 10/25/2023 0600 Gross per 24 hour  Intake 900 ml  Output 0 ml  Net 900 ml   Filed Weights   10/24/23 0156  Weight: (!) 155.3 kg    Examination: General exam: Overall comfortable, not in distress, morbidly obese HEENT: PERRL Respiratory system:  no wheezes  or crackles  Cardiovascular system: S1 & S2 heard, RRR.  Gastrointestinal system: Abdomen is nondistended, soft and nontender.  Mild tenderness in the right upper quadrant Central nervous system: Alert and oriented Extremities: No edema, no clubbing ,no cyanosis Skin: No rashes, no ulcers,no icterus         Data Reviewed: I have personally reviewed following labs and imaging  studies  CBC: Recent Labs  Lab 10/23/23 1929 10/24/23 0247 10/25/23 0517  WBC 6.1 5.3 5.5  HGB 10.5* 9.8* 9.8*  HCT 36.0 33.7* 32.6*  MCV 84.9 85.1 85.1  PLT 363 300 330   Basic Metabolic Panel: Recent Labs  Lab 10/23/23 1929 10/23/23 2212 10/24/23 0247 10/25/23 0501  NA 140  --  139 137  K 4.3  --  3.9 3.5  CL 105  --  105 108  CO2 26  --  24 23  GLUCOSE 89  --  91 76  BUN 14  --  11 9  CREATININE 0.89  --  0.76 0.84  CALCIUM 9.0  --  8.5* 8.2*  MG  --  2.0  --   --      No results found for this or any previous visit (from the past 240 hours).   Radiology Studies: US  Abdomen Limited Result Date: 10/23/2023 CLINICAL DATA:  Right upper quadrant pain. EXAM: ULTRASOUND ABDOMEN LIMITED RIGHT UPPER QUADRANT COMPARISON:  None Available. FINDINGS: Gallbladder: The gallbladder is contracted and subsequently limited in evaluation. Multiple gallstones are seen within the gallbladder lumen. The largest measures approximately 9.7 mm. Gallbladder wall measures 5.1 mm in thickness. No sonographic Murphy sign noted by sonographer. Common bile duct: Diameter: 15.2 mm (measured 10 mm on the prior study) Liver: No focal lesion identified. Intrahepatic biliary dilatation is seen. Diffusely increased echogenicity of the liver parenchyma is noted. Portal vein is patent on color Doppler imaging with normal direction of blood flow towards the liver. Other: None. IMPRESSION: 1. Contracted gallbladder and subsequently limited study. 2. Cholelithiasis. 3. Gallbladder wall thickening which is likely, in part, secondary to the contracted nature of the gallbladder. 4. Dilated common bile duct with intrahepatic biliary dilatation. Further evaluation with nonemergent MRCP is recommended. 5. Hepatic steatosis. Electronically Signed   By: Suzen Dials M.D.   On: 10/23/2023 20:23    Scheduled Meds: Continuous Infusions:  sodium chloride  75 mL/hr at 10/25/23 9060   ampicillin -sulbactam (UNASYN ) 1.5 g  in sodium chloride  0.9 % 100 mL IVPB       LOS: 2 days   Ivonne Mustache, MD [facility] P8/25/2025, 10:07 AM

## 2023-10-25 NOTE — Progress Notes (Signed)
   10/25/23 0948  TOC Brief Assessment  Insurance and Status Reviewed  Patient has primary care physician Yes  Home environment has been reviewed resides in an apartment  Prior level of function: Independent  Prior/Current Home Services No current home services  Social Drivers of Health Review SDOH reviewed no interventions necessary  Transition of care needs no transition of care needs at this time

## 2023-10-25 NOTE — Telephone Encounter (Signed)
 Unable to reach pt via phone- will await further communication from the pt

## 2023-10-25 NOTE — Transfer of Care (Signed)
 Immediate Anesthesia Transfer of Care Note  Patient: Cheryl [name]  Procedure(s) Performed: ERCP, WITH INTERVENTION IF INDICATED  Patient Location: Endoscopy Unit  Anesthesia Type:General  Level of Consciousness: awake and patient cooperative  Airway & Oxygen Therapy: Patient Spontanous Breathing and Patient connected to face mask  Post-op Assessment: Report given to RN and Post -op Vital signs reviewed and stable  Post vital signs: Reviewed and stable  Last Vitals:  Vitals Value Taken Time  BP    Temp    Pulse 71 10/25/23 14:06  Resp    SpO2 100 % 10/25/23 14:06  Vitals shown include unfiled device data.  Last Pain:  Vitals:   10/25/23 1217  TempSrc: Temporal  PainSc: 3       Patients Stated Pain Goal: 0 (10/25/23 1217)  Complications: No notable events documented.

## 2023-10-26 ENCOUNTER — Encounter (HOSPITAL_COMMUNITY): Payer: Self-pay | Admitting: Anesthesiology

## 2023-10-26 ENCOUNTER — Observation Stay (HOSPITAL_COMMUNITY): Payer: Self-pay | Admitting: Anesthesiology

## 2023-10-26 ENCOUNTER — Encounter (HOSPITAL_COMMUNITY): Payer: Self-pay | Admitting: Family Medicine

## 2023-10-26 ENCOUNTER — Encounter (HOSPITAL_COMMUNITY)
Admission: EM | Disposition: A | Discharge status: Home / Self Care | Payer: Self-pay | Source: Home / Self Care | Attending: Emergency Medicine

## 2023-10-26 DIAGNOSIS — F418 Other specified anxiety disorders: Secondary | ICD-10-CM | POA: Diagnosis not present

## 2023-10-26 DIAGNOSIS — R7989 Other specified abnormal findings of blood chemistry: Secondary | ICD-10-CM

## 2023-10-26 DIAGNOSIS — Z6841 Body Mass Index (BMI) 40.0 and over, adult: Secondary | ICD-10-CM

## 2023-10-26 DIAGNOSIS — K8020 Calculus of gallbladder without cholecystitis without obstruction: Secondary | ICD-10-CM

## 2023-10-26 DIAGNOSIS — K8050 Calculus of bile duct without cholangitis or cholecystitis without obstruction: Secondary | ICD-10-CM | POA: Diagnosis not present

## 2023-10-26 LAB — COMPREHENSIVE METABOLIC PANEL WITH GFR
ALT: 114 U/L — ABNORMAL HIGH (ref 0–44)
AST: 35 U/L (ref 15–41)
Albumin: 2.9 g/dL — ABNORMAL LOW (ref 3.5–5.0)
Alkaline Phosphatase: 204 U/L — ABNORMAL HIGH (ref 38–126)
Anion gap: 9 (ref 5–15)
BUN: 7 mg/dL (ref 6–20)
CO2: 24 mmol/L (ref 22–32)
Calcium: 8.7 mg/dL — ABNORMAL LOW (ref 8.9–10.3)
Chloride: 106 mmol/L (ref 98–111)
Creatinine, Ser: 0.81 mg/dL (ref 0.44–1.00)
GFR, Estimated: >60 mL/min (ref >60)
Glucose, Bld: 83 mg/dL (ref 70–99)
Potassium: 4 mmol/L (ref 3.5–5.1)
Sodium: 139 mmol/L (ref 135–145)
Total Bilirubin: 0.2 mg/dL (ref 0.0–1.2)
Total Protein: 6.7 g/dL (ref 6.5–8.1)

## 2023-10-26 LAB — CBC
HCT: 32.9 % — ABNORMAL LOW (ref 36.0–46.0)
Hemoglobin: 10 g/dL — ABNORMAL LOW (ref 12.0–15.0)
MCH: 26.2 pg (ref 26.0–34.0)
MCHC: 30.4 g/dL (ref 30.0–36.0)
MCV: 86.1 fL (ref 80.0–100.0)
Platelets: 373 K/uL (ref 150–400)
RBC: 3.82 MIL/uL — ABNORMAL LOW (ref 3.87–5.11)
RDW: 19.3 % — ABNORMAL HIGH (ref 11.5–15.5)
WBC: 7.3 K/uL (ref 4.0–10.5)
nRBC: 0 % (ref 0.0–0.2)

## 2023-10-26 LAB — SURGICAL PCR SCREEN
MRSA, PCR: NEGATIVE
Staphylococcus aureus: POSITIVE — AB

## 2023-10-26 SURGERY — LAPAROSCOPIC CHOLECYSTECTOMY
Anesthesia: General

## 2023-10-26 MED ORDER — LACTATED RINGERS IR SOLN
Status: DC | PRN
  Administered 2023-10-26: 1000 mL

## 2023-10-26 MED ORDER — MIDAZOLAM HCL 2 MG/2ML IJ SOLN
INTRAMUSCULAR | Status: AC
  Filled 2023-10-26: qty 2

## 2023-10-26 MED ORDER — CEFAZOLIN SODIUM 1 G IJ SOLR
INTRAMUSCULAR | Status: AC
  Filled 2023-10-26: qty 20

## 2023-10-26 MED ORDER — ONDANSETRON HCL 4 MG/2ML IJ SOLN
INTRAMUSCULAR | Status: AC
  Filled 2023-10-26: qty 2

## 2023-10-26 MED ORDER — ONDANSETRON HCL 4 MG/2ML IJ SOLN: 4.0000 mg | Freq: Once | INTRAMUSCULAR | Status: DC | PRN

## 2023-10-26 MED ORDER — TRAMADOL HCL 50 MG PO TABS
50.0000 mg | ORAL_TABLET | Freq: Four times a day (QID) | ORAL | Status: DC | PRN
  Administered 2023-10-26 – 2023-10-27 (×2): 50 mg via ORAL
  Filled 2023-10-26 (×2): qty 1

## 2023-10-26 MED ORDER — MUPIROCIN 2 % EX OINT
1.0000 | TOPICAL_OINTMENT | Freq: Two times a day (BID) | CUTANEOUS | Status: DC
  Administered 2023-10-26 – 2023-10-27 (×3): 1 via NASAL
  Filled 2023-10-26 (×2): qty 22

## 2023-10-26 MED ORDER — CHLORHEXIDINE GLUCONATE CLOTH 2 % EX PADS
6.0000 | MEDICATED_PAD | Freq: Every day | CUTANEOUS | Status: DC
  Administered 2023-10-27: 6 via TOPICAL

## 2023-10-26 MED ORDER — OXYCODONE HCL 5 MG PO TABS: 5.0000 mg | ORAL_TABLET | Freq: Once | ORAL | Status: DC | PRN

## 2023-10-26 MED ORDER — BUPIVACAINE-EPINEPHRINE (PF) 0.25% -1:200000 IJ SOLN
INTRAMUSCULAR | Status: AC
  Filled 2023-10-26: qty 30

## 2023-10-26 MED ORDER — BUPIVACAINE-EPINEPHRINE 0.25% -1:200000 IJ SOLN
INTRAMUSCULAR | Status: DC | PRN
  Administered 2023-10-26: 25 mL

## 2023-10-26 MED ORDER — HYDROMORPHONE HCL 1 MG/ML IJ SOLN
0.2500 mg | INTRAMUSCULAR | Status: DC | PRN
  Administered 2023-10-26 (×3): 0.5 mg via INTRAVENOUS

## 2023-10-26 MED ORDER — SUGAMMADEX SODIUM 200 MG/2ML IV SOLN
INTRAVENOUS | Status: AC
  Filled 2023-10-26: qty 2

## 2023-10-26 MED ORDER — LIDOCAINE 2% (20 MG/ML) 5 ML SYRINGE
INTRAMUSCULAR | Status: DC | PRN
  Administered 2023-10-26: 100 mg via INTRAVENOUS

## 2023-10-26 MED ORDER — SUGAMMADEX SODIUM 200 MG/2ML IV SOLN
INTRAVENOUS | Status: DC | PRN
  Administered 2023-10-26: 400 mg via INTRAVENOUS

## 2023-10-26 MED ORDER — DEXAMETHASONE SODIUM PHOSPHATE 10 MG/ML IJ SOLN
INTRAMUSCULAR | Status: DC | PRN
  Administered 2023-10-26: 10 mg via INTRAVENOUS

## 2023-10-26 MED ORDER — PROPOFOL 10 MG/ML IV BOLUS
INTRAVENOUS | Status: DC | PRN
  Administered 2023-10-26: 180 mg via INTRAVENOUS

## 2023-10-26 MED ORDER — HYDROMORPHONE HCL 1 MG/ML IJ SOLN
INTRAMUSCULAR | Status: AC
  Filled 2023-10-26: qty 1

## 2023-10-26 MED ORDER — LACTATED RINGERS IV SOLN: INTRAVENOUS | Status: AC

## 2023-10-26 MED ORDER — ONDANSETRON HCL 4 MG/2ML IJ SOLN
INTRAMUSCULAR | Status: DC | PRN
  Administered 2023-10-26: 4 mg via INTRAVENOUS

## 2023-10-26 MED ORDER — OXYCODONE HCL 5 MG/5ML PO SOLN: 5.0000 mg | Freq: Once | ORAL | Status: DC | PRN

## 2023-10-26 MED ORDER — DEXAMETHASONE SODIUM PHOSPHATE 10 MG/ML IJ SOLN
INTRAMUSCULAR | Status: AC
Start: 2023-10-26 — End: 2023-10-26
  Filled 2023-10-26: qty 1

## 2023-10-26 MED ORDER — CHLORHEXIDINE GLUCONATE 0.12 % MT SOLN
15.0000 mL | Freq: Once | OROMUCOSAL | Status: AC
  Administered 2023-10-26: 15 mL via OROMUCOSAL

## 2023-10-26 MED ORDER — KETOROLAC TROMETHAMINE 30 MG/ML IJ SOLN
INTRAMUSCULAR | Status: AC
  Filled 2023-10-26: qty 1

## 2023-10-26 MED ORDER — MORPHINE SULFATE (PF) 2 MG/ML IV SOLN
1.0000 mg | INTRAVENOUS | Status: DC | PRN
  Administered 2023-10-26 – 2023-10-27 (×2): 2 mg via INTRAVENOUS
  Filled 2023-10-26 (×2): qty 1

## 2023-10-26 MED ORDER — DEXMEDETOMIDINE HCL IN NACL 80 MCG/20ML IV SOLN
INTRAVENOUS | Status: DC | PRN
  Administered 2023-10-26: 16 ug via INTRAVENOUS

## 2023-10-26 MED ORDER — MIDAZOLAM HCL 2 MG/2ML IJ SOLN
INTRAMUSCULAR | Status: DC | PRN
Start: 2023-10-26 — End: 2023-10-26
  Administered 2023-10-26: 2 mg via INTRAVENOUS

## 2023-10-26 MED ORDER — 0.9 % SODIUM CHLORIDE (POUR BTL) OPTIME
TOPICAL | Status: DC | PRN
  Administered 2023-10-26: 1000 mL

## 2023-10-26 MED ORDER — KETOROLAC TROMETHAMINE 30 MG/ML IJ SOLN
INTRAMUSCULAR | Status: DC | PRN
  Administered 2023-10-26: 30 mg via INTRAVENOUS

## 2023-10-26 MED ORDER — ROCURONIUM 10MG/ML (10ML) SYRINGE FOR MEDFUSION PUMP - OPTIME
INTRAVENOUS | Status: DC | PRN
  Administered 2023-10-26: 100 mg via INTRAVENOUS

## 2023-10-26 MED ORDER — DEXAMETHASONE SODIUM PHOSPHATE 10 MG/ML IJ SOLN
INTRAMUSCULAR | Status: AC
  Filled 2023-10-26: qty 1

## 2023-10-26 MED ORDER — KETOROLAC TROMETHAMINE 30 MG/ML IJ SOLN: 30.0000 mg | Freq: Once | INTRAMUSCULAR | Status: DC | PRN

## 2023-10-26 MED ORDER — FENTANYL CITRATE (PF) 250 MCG/5ML IJ SOLN
INTRAMUSCULAR | Status: AC
  Filled 2023-10-26: qty 5

## 2023-10-26 MED ORDER — FENTANYL CITRATE (PF) 250 MCG/5ML IJ SOLN
INTRAMUSCULAR | Status: DC | PRN
  Administered 2023-10-26: 100 ug via INTRAVENOUS
  Administered 2023-10-26 (×4): 50 ug via INTRAVENOUS

## 2023-10-26 MED ORDER — FENTANYL CITRATE (PF) 250 MCG/5ML IJ SOLN
INTRAMUSCULAR | Status: AC
Start: 2023-10-26 — End: 2023-10-26
  Filled 2023-10-26: qty 5

## 2023-10-26 MED ORDER — PROPOFOL 10 MG/ML IV BOLUS
INTRAVENOUS | Status: AC
  Filled 2023-10-26: qty 20

## 2023-10-26 SURGICAL SUPPLY — 33 items
BAG COUNTER SPONGE SURGICOUNT (BAG) IMPLANT
CABLE HIGH FREQUENCY MONO STRZ (ELECTRODE) ×1 IMPLANT
CATH REDDICK CHOLANGI 4FR 50CM (CATHETERS) IMPLANT
CHLORAPREP W/TINT 26 (MISCELLANEOUS) ×1 IMPLANT
CLIP APPLIE 5 13 M/L LIGAMAX5 (MISCELLANEOUS) ×1 IMPLANT
COVER MAYO STAND STRL (DRAPES) ×1 IMPLANT
COVER MAYO STAND XLG (MISCELLANEOUS) IMPLANT
COVER SURGICAL LIGHT HANDLE (MISCELLANEOUS) ×1 IMPLANT
DERMABOND ADVANCED .7 DNX12 (GAUZE/BANDAGES/DRESSINGS) ×1 IMPLANT
DRAPE C-ARM 42X120 X-RAY (DRAPES) IMPLANT
ELECT REM PT RETURN 15FT ADLT (MISCELLANEOUS) ×1 IMPLANT
ENDOLOOP SUT PDS II 0 18 (SUTURE) IMPLANT
GLOVE BIO SURGEON STRL SZ7.5 (GLOVE) ×1 IMPLANT
GOWN STRL REUS W/ TWL XL LVL3 (GOWN DISPOSABLE) ×1 IMPLANT
HEMOSTAT SURGICEL 4X8 (HEMOSTASIS) ×1 IMPLANT
IRRIGATION SUCT STRKRFLW 2 WTP (MISCELLANEOUS) ×1 IMPLANT
IV CATH 14GX2 1/4 (CATHETERS) IMPLANT
KIT BASIN OR (CUSTOM PROCEDURE TRAY) ×1 IMPLANT
KIT TURNOVER KIT A (KITS) ×1 IMPLANT
PAD POSITIONING PINK XL (MISCELLANEOUS) IMPLANT
PROTECTOR NERVE ULNAR (MISCELLANEOUS) IMPLANT
SCISSORS LAP 5X35 DISP (ENDOMECHANICALS) ×1 IMPLANT
SET TUBE SMOKE EVAC HIGH FLOW (TUBING) ×1 IMPLANT
SLEEVE Z-THREAD 5X100MM (TROCAR) ×2 IMPLANT
SPIKE FLUID TRANSFER (MISCELLANEOUS) ×1 IMPLANT
SUT MNCRL AB 4-0 PS2 18 (SUTURE) ×1 IMPLANT
SYSTEM BAG RETRIEVAL 10MM (BASKET) IMPLANT
TAPE CLOTH 4X10 WHT NS (GAUZE/BANDAGES/DRESSINGS) IMPLANT
TOWEL OR 17X26 10 PK STRL BLUE (TOWEL DISPOSABLE) ×1 IMPLANT
TRAY LAPAROSCOPIC (CUSTOM PROCEDURE TRAY) ×1 IMPLANT
TROCAR 11X100 Z THREAD (TROCAR) IMPLANT
TROCAR BALLN 12MMX100 BLUNT (TROCAR) ×1 IMPLANT
TROCAR Z-THREAD OPTICAL 5X100M (TROCAR) ×1 IMPLANT

## 2023-10-26 NOTE — Op Note (Signed)
 10/26/2023  11:06 AM  PATIENT:  Cheryl [name]  33 y.o. female  PRE-OPERATIVE DIAGNOSIS:  CHOLELITHIASIS  POST-OPERATIVE DIAGNOSIS:  CHOLELITHIASIS  PROCEDURE:  Procedure(s): LAPAROSCOPIC CHOLECYSTECTOMY (N/A)  SURGEON:  Surgeons and Role:    * Curvin Deward MOULD, MD - Primary  PHYSICIAN ASSISTANT:   ASSISTANTS: none   ANESTHESIA:   local and general  EBL:  minimal   BLOOD ADMINISTERED:none  DRAINS: none   LOCAL MEDICATIONS USED:  MARCAINE      SPECIMEN:  Source of Specimen:  gallbladder  DISPOSITION OF SPECIMEN:  PATHOLOGY  COUNTS:  YES  TOURNIQUET:  * No tourniquets in log *  DICTATION: .Dragon Dictation    Procedure: After informed consent was obtained the patient was brought to the operating room and placed in the supine position on the operating room table. After adequate induction of general anesthesia the patient's abdomen was prepped with ChloraPrep allowed to dry and draped in usual sterile manner. An appropriate timeout was performed. The area above the umbilicus was infiltrated with quarter percent  Marcaine . A small incision was made with a 15 blade knife. The incision was carried down through the subcutaneous tissue bluntly with a hemostat and [employer] retractors. The linea alba was identified. The linea alba was incised with a 15 blade knife and each side was grasped with Coker clamps. The preperitoneal space was then probed with a hemostat until the peritoneum was opened and access was gained to the abdominal cavity. A 0 Vicryl pursestring stitch was placed in the fascia surrounding the opening. A Hassan cannula was then placed through the opening and anchored in place with the previously placed Vicryl purse string stitch. The abdomen was insufflated with carbon dioxide without difficulty. A laparoscope was inserted through the [facility] cannula in the right upper quadrant was inspected. Next the epigastric region was infiltrated with % Marcaine . A small incision was  made with a 15 blade knife. A 5 mm port was placed bluntly through this incision into the abdominal cavity under direct vision. Next 2 sites were chosen laterally on the right side of the abdomen for placement of 5 mm ports. Each of these areas was infiltrated with quarter percent Marcaine . Small stab incisions were made with a 15 blade knife. 5 mm ports were then placed bluntly through these incisions into the abdominal cavity under direct vision without difficulty. A blunt grasper was placed through the lateralmost 5 mm port and used to grasp the dome of the gallbladder and elevate it anteriorly and superiorly. Another blunt grasper was placed through the other 5 mm port and used to retract the body and neck of the gallbladder. A dissector was placed through the epigastric port and using the electrocautery the peritoneal reflection at the gallbladder neck was opened. Blunt dissection was then carried out in this area until the gallbladder neck-cystic duct junction was readily identified and a good critical window was created. A single clip was placed on the gallbladder neck.  The cystic duct was too thick to hold the clip.  At this point I dissected the gallbladder away from the liver with the hook cautery from a top-down technique.   the cystic artery was identified and again dissected bluntly in a circumferential manner until a good critical window  was created. 2 clips were placed proximally and one distally on the artery and the artery was divided between the 2 sets of clips.  Next a 0 PDS Endoloop was placed through one of the 5 mm ports and  around the gallbladder.  The Endoloop was cinched down right at the neck of the gallbladder where it joined the cystic duct.  The gallbladder neck was then divided with laparoscopic scissors and detached from the patient.  The gallbladder was then placed in an endoscopic bag and sealed.  The liver bed was inspected again and found to be hemostatic.  The abdomen was then  irrigated with copious amounts of saline.  The bag with the gallbladder was then removed with the [facility] cannula through the infraumbilical port without difficulty. The fascial defect was then closed with the previously placed Vicryl pursestring stitch as well as with another figure-of-eight 0 Vicryl stitch. The liver bed was inspected again and found to be hemostatic. The abdomen was irrigated with copious amounts of saline until the effluent was clear. The ports were then removed under direct vision without difficulty and were found to be hemostatic. The gas was allowed to escape. No other abnormalities were noted on general inspection of the abdomen. The skin incisions were all closed with interrupted 4-0 Monocryl subcuticular stitches. Dermabond dressings were applied. The patient tolerated the procedure well. At the end of the case all needle sponge and instrument counts were correct. The patient was then awakened and taken to recovery in stable condition  PLAN OF CARE: Admit to inpatient   PATIENT DISPOSITION:  PACU - hemodynamically stable.   Delay start of Pharmacological VTE agent (>24hrs) due to surgical blood loss or risk of bleeding: no

## 2023-10-26 NOTE — Anesthesia Postprocedure Evaluation (Signed)
 Anesthesia Post Note  Patient: Cheryl [name]  Procedure(s) Performed: LAPAROSCOPIC CHOLECYSTECTOMY     Patient location during evaluation: PACU Anesthesia Type: General Level of consciousness: awake and alert Pain management: pain level controlled Vital Signs Assessment: post-procedure vital signs reviewed and stable Respiratory status: spontaneous breathing, nonlabored ventilation, respiratory function stable and patient connected to nasal cannula oxygen Cardiovascular status: blood pressure returned to baseline and stable Postop Assessment: no apparent nausea or vomiting Anesthetic complications: no   No notable events documented.  Last Vitals:  Vitals:   10/26/23 1245 10/26/23 1302  BP: (!) 144/94 137/87  Pulse: (!) 50 (!) 50  Resp: (!) 22 16  Temp:  36.7 C  SpO2: 97% 99%    Last Pain:  Vitals:   10/26/23 1302  TempSrc:   PainSc: 0-No pain                 Garnette DELENA Gab

## 2023-10-26 NOTE — Progress Notes (Signed)
 PROGRESS NOTE  Cheryl [name]  FMW:[id number] DOB: [birth year] DOA: 10/23/2023 PCP: Knute Thersia Bitters, FNP   Brief Narrative: Patient is a 33 year old female with history of iron deficiency anemia, prediabetes, cholelithiasis/choledocholithiasis who presented with complaint of abdominal pain mainly in the right upper quadrant, mild nausea.  No vomiting, fever or chills.  Her right upper quadrant USG back on 08/06/2023 showed dilation of CBD with no intrahepatic biliary duct dilatation or visualized choledocholithiasis.  MRCP on 8/25 showed cholelithiasis, choledocholithiasis with dilated common bile duct measuring up to 10 mm.  No signs of cholecystitis.  On presentation, she was hemodynamically stable.  Lab work showed AST of 151, ALT of 108.  Ultrasound of the abdomen showed contracted gallbladder, cholelithiasis, gallbladder wall thickening, dilated CBD with intrahepatic biliary dilatation.  GI consulted.    General surgery also consulted for consideration of cholecystectomy. S/P  ERCP status post sphincterectomy, duct stone extraction on 8/25.  Undergoing lap cholecystectomy today  Assessment & Plan:  Principal Problem:   Choledocholithiasis Active Problems:   Elevated blood pressure reading   Iron deficiency anemia due to chronic blood loss   Prediabetes  Choledocholithiasis/cholelithiasis: Presented with right upper quadrant pain, elevated liver enzymes.  Imaging as above.  GI , surgery consultation obtained.  Liver enzymes have trended down No fever or chills or leukocytosis.    S/P  ERCP status post sphincterectomy, duct stone extraction on 8/25.  Undergoing lap cholecystectomy today  Right-sided chest pain: New problem.  This is most likely referred pain from right upper quadrant.   chest x-ray without any abnormalities  History of iron deficiency anemia: Due to menorrhagia.  Currently hemoglobin stable.  Prediabetes: A1c of 6.  We recommend diet modification, lifestyle  changes  Morbid obesity: BMI of 60.6. We recommend diet modification, lifestyle changes           DVT prophylaxis:SCDs Start: 10/23/23 2115     Code Status: Full Code  Family Communication: None at the bedside  Patient status:Inpatient  Patient is from :Home  Anticipated discharge un:Ynfz  Estimated DC date: Tomorrow   Consultants: GI ,General Surgery  Procedures: ERCP, lap chole  Antimicrobials:  Anti-infectives (From admission, onward)    Start     Dose/Rate Route Frequency Ordered Stop   10/26/23 0900  [MAR Hold]  cefTRIAXone  (ROCEPHIN ) 2 g in sodium chloride  0.9 % 100 mL IVPB        (MAR Hold since Tue 10/26/2023 at 0831.Hold Reason: Transfer to a Procedural area)   2 g 200 mL/hr over 30 Minutes Intravenous On call to O.R. 10/25/23 1449 10/26/23 1000   10/25/23 0600  ampicillin -sulbactam (UNASYN ) 1.5 g in sodium chloride  0.9 % 100 mL IVPB  Status:  Discontinued        1.5 g 200 mL/hr over 30 Minutes Intravenous On call to O.R. 10/24/23 2103 10/25/23 1447       Subjective: Patient seen and examined at the bedside at PACU.  She was just about from the OR.  She was still sedated from the effect of anesthesia.  Hemodynamically stable.  Objective: Vitals:   10/26/23 0644 10/26/23 0832 10/26/23 0835 10/26/23 1116  BP: (!) 162/90  (!) 158/84 (!) 158/96  Pulse: (!) 59  (!) 55 (!) 52  Resp: 17  17 12   Temp: 98.1 F (36.7 C)  99.1 F (37.3 C) (P) 98.1 F (36.7 C)  TempSrc: Oral  Oral   SpO2: 97%  99% 100%  Weight:  (!) 155.3 kg  Height:  5' 3 (1.6 m)      Intake/Output Summary (Last 24 hours) at 10/26/2023 1121 Last data filed at 10/26/2023 1114 Gross per 24 hour  Intake 3121.89 ml  Output 20 ml  Net 3101.89 ml   Filed Weights   10/24/23 0156 10/26/23 0832  Weight: (!) 155.3 kg (!) 155.3 kg    Examination: General exam: Overall comfortable, not in distress, morbidly obese, HEENT: PERRL Respiratory system:  no wheezes or crackles   Cardiovascular system: S1 & S2 heard, RRR.  Gastrointestinal system: Abdomen is nondistended, soft and nontender.  Continue lap chole surgical wounds Central nervous system: Sedated from anesthesia Extremities: No edema, no clubbing ,no cyanosis Skin: No rashes, no ulcers,no icterus           Data Reviewed: I have personally reviewed following labs and imaging studies  CBC: Recent Labs  Lab 10/23/23 1929 10/24/23 0247 10/25/23 0517 10/26/23 0436  WBC 6.1 5.3 5.5 7.3  HGB 10.5* 9.8* 9.8* 10.0*  HCT 36.0 33.7* 32.6* 32.9*  MCV 84.9 85.1 85.1 86.1  PLT 363 300 330 373   Basic Metabolic Panel: Recent Labs  Lab 10/23/23 1929 10/23/23 2212 10/24/23 0247 10/25/23 0501 10/26/23 0436  NA 140  --  139 137 139  K 4.3  --  3.9 3.5 4.0  CL 105  --  105 108 106  CO2 26  --  24 23 24   GLUCOSE 89  --  91 76 83  BUN 14  --  11 9 7   CREATININE 0.89  --  0.76 0.84 0.81  CALCIUM 9.0  --  8.5* 8.2* 8.7*  MG  --  2.0  --   --   --      Recent Results (from the past 240 hours)  Surgical pcr screen     Status: Abnormal   Collection Time: 10/25/23  8:50 PM   Specimen: Nasal Mucosa; Nasal Swab  Result Value Ref Range Status   MRSA, PCR NEGATIVE NEGATIVE Final   Staphylococcus aureus POSITIVE (A) NEGATIVE Final    Comment: (NOTE) The Xpert SA Assay (FDA approved for NASAL specimens in patients 41 years of age and older), is one component of a comprehensive surveillance program. It is not intended to diagnose infection nor to guide or monitor treatment. Performed at [facility], [address] [place], [place] [address]      Radiology Studies: DG CHEST PORT 1 VIEW Result Date: 10/25/2023 CLINICAL DATA:  Mid to right-sided chest pain EXAM: PORTABLE CHEST 1 VIEW COMPARISON:  None Available. FINDINGS: The heart size and mediastinal contours are within normal limits. Both lungs are clear. The visualized skeletal structures are unremarkable. IMPRESSION: No active  disease. Electronically Signed   By: Ryan Salvage M.D.   On: 10/25/2023 10:11    Scheduled Meds:  [MAR Hold] Chlorhexidine  Gluconate Cloth  6 each Topical Daily   [MAR Hold] mupirocin  ointment  1 Application Nasal BID   Continuous Infusions:  lactated ringers  10 mL/hr at 10/26/23 0850     LOS: 2 days   Ivonne Mustache, MD [facility] P8/26/2025, 11:21 AM

## 2023-10-26 NOTE — Interval H&P Note (Signed)
 History and Physical Interval Note:  10/26/2023 8:16 AM  Cheryl [name]  has presented today for surgery, with the diagnosis of CHOLELITHIASIS.  The various methods of treatment have been discussed with the patient and family. After consideration of risks, benefits and other options for treatment, the patient has consented to  Procedure(s): LAPAROSCOPIC CHOLECYSTECTOMY (N/A) as a surgical intervention.  The patient's history has been reviewed, patient examined, no change in status, stable for surgery.  I have reviewed the patient's chart and labs.  Questions were answered to the patient's satisfaction.     Deward Null III

## 2023-10-26 NOTE — Anesthesia Procedure Notes (Signed)
 Procedure Name: Intubation Date/Time: 10/26/2023 9:52 AM  Performed by: Nanci Riis, CRNAPre-anesthesia Checklist: Patient identified, Emergency Drugs available, Suction available, Patient being monitored and Timeout performed Patient Re-evaluated:Patient Re-evaluated prior to induction Oxygen Delivery Method: Circle system utilized Preoxygenation: Pre-oxygenation with 100% oxygen Induction Type: IV induction Ventilation: Mask ventilation without difficulty Laryngoscope Size: Miller and 3 Grade View: Grade I Tube type: Oral Tube size: 7.0 mm Number of attempts: 1 Airway Equipment and Method: Stylet Placement Confirmation: ETT inserted through vocal cords under direct vision, positive ETCO2 and breath sounds checked- equal and bilateral Secured at: 21 cm Tube secured with: Tape Dental Injury: Teeth and Oropharynx as per pre-operative assessment

## 2023-10-26 NOTE — Plan of Care (Signed)

## 2023-10-26 NOTE — Transfer of Care (Signed)
 Immediate Anesthesia Transfer of Care Note  Patient: Cheryl [name]  Procedure(s) Performed: LAPAROSCOPIC CHOLECYSTECTOMY  Patient Location: PACU  Anesthesia Type:General  Level of Consciousness: drowsy and patient cooperative  Airway & Oxygen Therapy: Patient Spontanous Breathing and Patient connected to face mask oxygen  Post-op Assessment: Report given to RN and Post -op Vital signs reviewed and stable  Post vital signs: Reviewed and stable  Last Vitals:  Vitals Value Taken Time  BP 158/96 10/26/23 11:16  Temp    Pulse 53 10/26/23 11:18  Resp 21 10/26/23 11:18  SpO2 100 % 10/26/23 11:18  Vitals shown include unfiled device data.  Last Pain:  Vitals:   10/26/23 0835  TempSrc: Oral  PainSc:       Patients Stated Pain Goal: 0 (10/25/23 1217)  Complications: No notable events documented.

## 2023-10-26 NOTE — Progress Notes (Addendum)
 [facility] Progress Note  CC:  RUQ pain, cholelithiasis    Subjective: She tolerated a clear liquid diet yesterday status post ERCP without any nausea, vomiting or worsening abdominal pain.  She remains NPO since midnight for cholecystectomy today. RUQ pain is well-controlled at this time, rates a 2 or 3 on a scale of 1-10.  She is having mild lower abdominal cramping. She passed a soft light brown stool yesterday and a very small light brown stool earlier this morning. She endorses having very mild right chest discomfort which has decreased since yesterday.  No cough or shortness of breath.   Objective:   ERCP 10/25/2023 by Dr. Abran: 1. The esophagus was blindly intubated with the side-viewing endoscope. The stomach, duodenum, and major ampulla were unremarkable. The minor ampulla was not sought.  2. Scout radiograph of the abdomen with the endoscope and position was unremarkable  3. The bile duct was selectively cannulated on the first pass of the catheter. Complete filling of the biliary tree revealed diffuse dilation. Maximal extrahepatic bile duct diameter was approximately 15 mm. The cystic duct and gallbladder filled. The patient's body habitus obscured the quality of the radiograph somewhat (grainy). Obvious significant filling defects were not appreciated.  4. A biliary sphincterotomy was made by cutting over a hydrophilic guidewire using the ERBE system, with cutting in the 12:00 orientation. The sphincterotomy was deemed moderately large.  5. A 12-15 mm balloon was pulled through the bile duct multiple times. Granular sludge extracted. Postextraction occlusion cholangiogram did not reveal any obvious filling defects.  Postextraction occlusion cholangiogram did not reveal any obvious filling defects. The bile duct drainage post sphincterotomy was excellent.  6. There was no injection or manipulation of the pancreatic duct, by intent.  Impression: 1. Status post ERC  with sphincterotomy, duct stone extraction  2. Cholelithiasis  Vital signs in last 24 hours: Temp:  [97.4 F (36.3 C)-98.3 F (36.8 C)] 98.1 F (36.7 C) (08/26 0644) Pulse Rate:  [59-74] 59 (08/26 0644) Resp:  [12-18] 17 (08/26 0644) BP: (142-183)/(56-95) 162/90 (08/26 0644) SpO2:  [94 %-100 %] 97 % (08/26 0644) Last BM Date : 10/24/23 General: Alert 33 year old obese female in no acute distress. Heart: Regular rate and rhythm, no murmurs. Pulm: Breath sounds clear throughout. Abdomen: Soft, nondistended.  Mild RUQ and epigastric tenderness without rebound or guarding.  Positive bowel sounds to all 4 quadrants. Extremities: No lower extremity edema. Neurologic:  Alert and oriented x 4. Grossly normal neurologically. Psych:  Alert and cooperative. Normal mood and affect.  Intake/Output from previous day: 08/25 0701 - 08/26 0700 In: 1421.9 [P.O.:240; I.V.:981.9; IV Piggyback:200] Out: 0  Intake/Output this shift: No intake/output data recorded.  Lab Results: Recent Labs    10/24/23 0247 10/25/23 0517 10/26/23 0436  WBC 5.3 5.5 7.3  HGB 9.8* 9.8* 10.0*  HCT 33.7* 32.6* 32.9*  PLT 300 330 373   BMET Recent Labs    10/24/23 0247 10/25/23 0501 10/26/23 0436  NA 139 137 139  K 3.9 3.5 4.0  CL 105 108 106  CO2 24 23 24   GLUCOSE 91 76 83  BUN 11 9 7   CREATININE 0.76 0.84 0.81  CALCIUM 8.5* 8.2* 8.7*   LFT Recent Labs    10/26/23 0436  PROT 6.7  ALBUMIN 2.9*  AST 35  ALT 114*  ALKPHOS 204*  BILITOT 0.2   PT/INR Recent Labs    10/23/23 2212  LABPROT 13.2  INR 0.9   Hepatitis Panel No  results for input(s): HEPBSAG, HCVAB, HEPAIGM, HEPBIGM in the last 72 hours.  DG CHEST PORT 1 VIEW Result Date: 10/25/2023 CLINICAL DATA:  Mid to right-sided chest pain EXAM: PORTABLE CHEST 1 VIEW COMPARISON:  None Available. FINDINGS: The heart size and mediastinal contours are within normal limits. Both lungs are clear. The visualized skeletal structures are  unremarkable. IMPRESSION: No active disease. Electronically Signed   By: Ryan Salvage M.D.   On: 10/25/2023 10:11   Patient Profile: 33 year old female with a past medical history of anxiety, depression, morbid obesity, IDA and cholelithiasis with choledocholithiasis admitted 10/23/2023 with worsening RUQ pain and nausea without vomiting.    Assessment / Plan:  33 year old female with a history of gallstones with recurrent RUQ pain. Prior outpatient MRI/MRCP 10/02/2023 identified cholelithiasis, choledocholithiasis and CBD dilatation with plans for an ERCP 9/15. Admitted 10/23/2023 with worsening RUQ pain and elevated LFTs. No leukocytosis. RUQ sono 8/23 showed cholelithiasis with CBD and intrahepatic biliary dilatation. S/P ERCP with sphincterotomy and stone/granular sludge extracted. Right-sided chest pain has significantly diminished, likely secondary to choledocholithiasis, low suspicion for cardiac etiology. Patient is NPO for laparoscopic cholecystectomy today. LFTs down trending. T. Bili 0.2. Alk phos 204. AST 35. ALT 114. WBC 7.3. Afebrile. Hypertensive 6:44 am with BP 162/90. HR 59.  -NPO -Proceed with laparoscopic cholecystectomy today   Hepatic steatosis -Recommend outpatient follow up   Chronic IDA, intermittent menorrhagia.  No overt GI bleeding. Hg 10.5 -> 9.8 -> today Hg 10.0.   Hypertension  -Management per the hospitalist      Principal Problem:   Choledocholithiasis Active Problems:   Prediabetes   Iron deficiency anemia due to chronic blood loss   Elevated blood pressure reading     LOS: 2 days   Elida CHRISTELLA Shawl  10/26/2023, 8:29 AM  I have taken an interval history, thoroughly reviewed the chart and examined the patient. I agree with the Advanced Practitioner's note, impression and recommendations, and have recorded additional findings, impressions and recommendations below. I performed a substantive portion of this encounter (>50% time spent),  including a complete performance of the medical decision making.  My additional thoughts are as follows:  Uncomplicated ERCP with sphincterotomy and extraction of biliary sludge by Dr. Abran yesterday. (Report reviewed and discussed earlier today with Dr. Abran) Patient has since had a lap chole earlier today (operative report reviewed) LFTs slowly improving, and are expected to normalize within the next several weeks.  No further testing or treatment recommendations from our service, so we will sign off for now. As needed follow-up with Dr. Wilhelmenia at our office. _________________  This consultation required a moderate degree of medical decision making due to the nature and complexity of the acute condition(s) being evaluated as well as the patient's medical comorbidities.  Victory LITTIE Brand III Office:[phone]

## 2023-10-26 NOTE — Discharge Instructions (Signed)

## 2023-10-27 ENCOUNTER — Other Ambulatory Visit (HOSPITAL_COMMUNITY): Payer: Self-pay

## 2023-10-27 ENCOUNTER — Encounter (HOSPITAL_COMMUNITY): Payer: Self-pay | Admitting: General Surgery

## 2023-10-27 DIAGNOSIS — K8050 Calculus of bile duct without cholangitis or cholecystitis without obstruction: Secondary | ICD-10-CM | POA: Diagnosis not present

## 2023-10-27 LAB — COMPREHENSIVE METABOLIC PANEL WITH GFR
ALT: 95 U/L — ABNORMAL HIGH (ref 0–44)
AST: 25 U/L (ref 15–41)
Albumin: 3.4 g/dL — ABNORMAL LOW (ref 3.5–5.0)
Alkaline Phosphatase: 192 U/L — ABNORMAL HIGH (ref 38–126)
Anion gap: 12 (ref 5–15)
BUN: 10 mg/dL (ref 6–20)
CO2: 25 mmol/L (ref 22–32)
Calcium: 8.9 mg/dL (ref 8.9–10.3)
Chloride: 105 mmol/L (ref 98–111)
Creatinine, Ser: 0.9 mg/dL (ref 0.44–1.00)
GFR, Estimated: >60 mL/min (ref >60)
Glucose, Bld: 99 mg/dL (ref 70–99)
Potassium: 4 mmol/L (ref 3.5–5.1)
Sodium: 142 mmol/L (ref 135–145)
Total Bilirubin: <0.2 mg/dL (ref 0.0–1.2)
Total Protein: 6.3 g/dL — ABNORMAL LOW (ref 6.5–8.1)

## 2023-10-27 LAB — CBC
HCT: 32.8 % — ABNORMAL LOW (ref 36.0–46.0)
Hemoglobin: 9.8 g/dL — ABNORMAL LOW (ref 12.0–15.0)
MCH: 25.5 pg — ABNORMAL LOW (ref 26.0–34.0)
MCHC: 29.9 g/dL — ABNORMAL LOW (ref 30.0–36.0)
MCV: 85.4 fL (ref 80.0–100.0)
Platelets: 366 K/uL (ref 150–400)
RBC: 3.84 MIL/uL — ABNORMAL LOW (ref 3.87–5.11)
RDW: 19.3 % — ABNORMAL HIGH (ref 11.5–15.5)
WBC: 9.2 K/uL (ref 4.0–10.5)
nRBC: 0 % (ref 0.0–0.2)

## 2023-10-27 LAB — SURGICAL PATHOLOGY

## 2023-10-27 MED ORDER — ACETAMINOPHEN 500 MG PO TABS: 1000.0000 mg | ORAL_TABLET | Freq: Four times a day (QID) | ORAL | Status: AC | PRN

## 2023-10-27 MED ORDER — TRAMADOL HCL 50 MG PO TABS
50.0000 mg | ORAL_TABLET | Freq: Four times a day (QID) | ORAL | 0 refills | Status: AC | PRN
  Filled 2023-10-27: qty 15, 4d supply, fill #0

## 2023-10-27 MED ORDER — IBUPROFEN 200 MG PO TABS: 600.0000 mg | ORAL_TABLET | Freq: Four times a day (QID) | ORAL | Status: AC | PRN
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
## 2023-10-27 NOTE — Progress Notes (Signed)
 Discharge instructions given to patient no questions at this time. Discharge medications delivered to patient at bedside [facility]
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
## 2023-10-27 NOTE — Discharge Summary (Signed)
 Physician Discharge Summary  Cheryl Cheryl [name] Sole FMW:[id number] DOB: 1990-07-28 DOA: 10/23/2023  PCP: Knute Thersia Bitters, FNP  Admit date: 10/23/2023 Discharge date: 10/27/2023  Admitted From: Home Disposition:  Home  Discharge Condition:Stable CODE STATUS:FULL Diet recommendation:  Regular  Brief/Interim Summary: Patient is Cheryl [name] 33 year old female with history of iron deficiency anemia, prediabetes, cholelithiasis/choledocholithiasis who presented with complaint of abdominal pain mainly in the right upper quadrant, mild nausea.  No vomiting, fever or chills.  Her right upper quadrant USG back on 08/06/2023 showed dilation of CBD with no intrahepatic biliary duct dilatation or visualized choledocholithiasis.  MRCP on 8/25 showed cholelithiasis, choledocholithiasis with dilated common bile duct measuring up to 10 mm.  No signs of cholecystitis.  On presentation, she was hemodynamically stable.  Lab work showed AST of 151, ALT of 108.  Ultrasound of the abdomen showed contracted gallbladder, cholelithiasis, gallbladder wall thickening, dilated CBD with intrahepatic biliary dilatation.  GI consulted.    General surgery also consulted for consideration of cholecystectomy. S/P  ERCP status post sphincterectomy, duct stone extraction on 8/25. S/P lap cholecystectomy on 8/26.  General surgery cleared for discharge today  Following problems were addressed during the hospitalization:  Choledocholithiasis/cholelithiasis: Presented with right upper quadrant pain, elevated liver enzymes.  Imaging as above.  GI , surgery consultation obtained.  Liver enzymes have trended down  S/P  ERCP status post sphincterectomy, duct stone extraction on 8/25.  Underwent lap cholecystectomy today.  Abdomen benign on examination today.  General surgery cleared for discharge.  She will follow-up with general surgery as an outpatient.   Right-sided chest pain: New problem.  This is most likely referred pain from right upper quadrant.    chest x-ray without any abnormalities.  Resolved   History of iron deficiency anemia: Due to menorrhagia.  Currently hemoglobin stable.   Prediabetes: A1c of 6.  We recommend diet modification, lifestyle changes   Morbid obesity: BMI of 60.6. We recommend diet modification, lifestyle changes     Discharge Diagnoses:  Principal Problem:   Choledocholithiasis Active Problems:   Elevated blood pressure reading   Iron deficiency anemia due to chronic blood loss   Prediabetes   Elevated LFTs    Discharge Instructions  Discharge Instructions     Diet general   Complete by: As directed    Discharge instructions   Complete by: As directed    1)Please take your medications as instructed 2)Follow up with general surgery on the given appointment date.   Increase activity slowly   Complete by: As directed       Allergies as of 10/27/2023       Reactions   Shellfish Allergy Shortness Of Breath   Latex Itching, Rash   Powder inside the glove        Medication List     TAKE these medications    acetaminophen  500 MG tablet Commonly known as: TYLENOL  Take 2 tablets (1,000 mg total) by mouth every 6 (six) hours as needed. What changed:  when to take this reasons to take this   ibuprofen  200 MG tablet Commonly known as: ADVIL  Take 3 tablets (600 mg total) by mouth every 6 (six) hours as needed. What changed:  how much to take when to take this reasons to take this   traMADol  50 MG tablet Commonly known as: ULTRAM  Take 1 tablet (50 mg total) by mouth every 6 (six) hours as needed (mild pain).        Follow-up Information     Maczis,  Tonja Barban, PA-C Follow up on 11/18/2023.   Specialty: General Surgery Why: 2:30pm, Arrive 30 minutes prior to your appointment time, Please bring your insurance card and photo ID Contact information: [address] [facility] [place] [address] [phone]                Allergies   Allergen Reactions   Shellfish Allergy Shortness Of Breath   Latex Itching and Rash    Powder inside the glove    Consultations: General surgery, GI   Procedures/Studies: DG CHEST PORT 1 VIEW Result Date: 10/25/2023 CLINICAL DATA:  Mid to right-sided chest pain EXAM: PORTABLE CHEST 1 VIEW COMPARISON:  None Available. FINDINGS: The heart size and mediastinal contours are within normal limits. Both lungs are clear. The visualized skeletal structures are unremarkable. IMPRESSION: No active disease. Electronically Signed   By: Ryan Salvage M.D.   On: 10/25/2023 10:11   US  Abdomen Limited Result Date: 10/23/2023 CLINICAL DATA:  Right upper quadrant pain. EXAM: ULTRASOUND ABDOMEN LIMITED RIGHT UPPER QUADRANT COMPARISON:  None Available. FINDINGS: Gallbladder: The gallbladder is contracted and subsequently limited in evaluation. Multiple gallstones are seen within the gallbladder lumen. The largest measures approximately 9.7 mm. Gallbladder wall measures 5.1 mm in thickness. No sonographic Murphy sign noted by sonographer. Common bile duct: Diameter: 15.2 mm (measured 10 mm on the prior study) Liver: No focal lesion identified. Intrahepatic biliary dilatation is seen. Diffusely increased echogenicity of the liver parenchyma is noted. Portal vein is patent on color Doppler imaging with normal direction of blood flow towards the liver. Other: None. IMPRESSION: 1. Contracted gallbladder and subsequently limited study. 2. Cholelithiasis. 3. Gallbladder wall thickening which is likely, in part, secondary to the contracted nature of the gallbladder. 4. Dilated common bile duct with intrahepatic biliary dilatation. Further evaluation with nonemergent MRCP is recommended. 5. Hepatic steatosis. Electronically Signed   By: Suzen Dials M.D.   On: 10/23/2023 20:23   MR ABDOMEN MRCP W WO CONTAST Result Date: 10/02/2023 CLINICAL DATA:  Colicky right upper quadrant abdominal pain, calculus of gallbladder.  Dilated common bile duct. EXAM: MRI ABDOMEN WITHOUT AND WITH CONTRAST (INCLUDING MRCP) TECHNIQUE: Multiplanar multisequence MR imaging of the abdomen was performed both before and after the administration of intravenous contrast. Heavily T2-weighted images of the biliary and pancreatic ducts were obtained, and three-dimensional MRCP images were rendered by post processing. CONTRAST:  10mL GADAVIST  GADOBUTROL  1 MMOL/ML IV SOLN COMPARISON:  Ultrasound August 06, 2023 FINDINGS: Lower chest: No acute abnormality. Hepatobiliary: No suspicious hepatic lesion. No significant hepatic steatosis or iron deposition. Gallbladder is decompressed. Multiple stones in the gallbladder. Dilated common duct measuring up to 10 mm with multiple stones in the duct measuring up to 4 mm. No significant intrahepatic ductal dilation. Pancreas: No pancreatic ductal dilation or evidence of acute inflammation. Spleen:  Within normal limits in size and appearance. Adrenals/Urinary Tract: No masses identified. No evidence of hydronephrosis. Stomach/Bowel: Visualized portions within the abdomen are unremarkable. Vascular/Lymphatic: No pathologically enlarged lymph nodes identified. No abdominal aortic aneurysm demonstrated. Other:  None. Musculoskeletal: No suspicious bone lesions identified. IMPRESSION: Cholelithiasis and choledocholithiasis with dilated common duct measuring up to 10 mm. No significant intrahepatic ductal dilation. No MRI findings of acute cholecystitis. These results will be called to the ordering clinician or representative by the Radiologist Assistant, and communication documented in the PACS or [employer]. Electronically Signed   By: Reyes Holder M.D.   On: 10/02/2023 15:24   MR 3D Recon  At Scanner Result Date: 10/02/2023 CLINICAL DATA:  Colicky right upper quadrant abdominal pain, calculus of gallbladder. Dilated common bile duct. EXAM: MRI ABDOMEN WITHOUT AND WITH CONTRAST (INCLUDING MRCP) TECHNIQUE: Multiplanar  multisequence MR imaging of the abdomen was performed both before and after the administration of intravenous contrast. Heavily T2-weighted images of the biliary and pancreatic ducts were obtained, and three-dimensional MRCP images were rendered by post processing. CONTRAST:  10mL GADAVIST  GADOBUTROL  1 MMOL/ML IV SOLN COMPARISON:  Ultrasound August 06, 2023 FINDINGS: Lower chest: No acute abnormality. Hepatobiliary: No suspicious hepatic lesion. No significant hepatic steatosis or iron deposition. Gallbladder is decompressed. Multiple stones in the gallbladder. Dilated common duct measuring up to 10 mm with multiple stones in the duct measuring up to 4 mm. No significant intrahepatic ductal dilation. Pancreas: No pancreatic ductal dilation or evidence of acute inflammation. Spleen:  Within normal limits in size and appearance. Adrenals/Urinary Tract: No masses identified. No evidence of hydronephrosis. Stomach/Bowel: Visualized portions within the abdomen are unremarkable. Vascular/Lymphatic: No pathologically enlarged lymph nodes identified. No abdominal aortic aneurysm demonstrated. Other:  None. Musculoskeletal: No suspicious bone lesions identified. IMPRESSION: Cholelithiasis and choledocholithiasis with dilated common duct measuring up to 10 mm. No significant intrahepatic ductal dilation. No MRI findings of acute cholecystitis. These results will be called to the ordering clinician or representative by the Radiologist Assistant, and communication documented in the PACS or [employer]. Electronically Signed   By: Reyes Holder M.D.   On: 10/02/2023 15:24      Subjective: Seen and examined at bedside today.  Hemodynamically stable.  Overall comfortable, complains of some soreness around the abdomen mostly at the surgical wounds.  Abdomen is not distended or tender.  Good bowel sounds present.  General surgery cleared for discharge  Discharge Exam: Vitals:   10/27/23 0145 10/27/23 0656  BP: (!)  145/81 (!) 159/87  Pulse: 64 (!) 56  Resp: 18 15  Temp: 98.2 F (36.8 C) 98.3 F (36.8 C)  SpO2: 100% 96%   Vitals:   10/26/23 1749 10/26/23 2158 10/27/23 0145 10/27/23 0656  BP: (!) 146/83 111/69 (!) 145/81 (!) 159/87  Pulse: (!) 57 65 64 (!) 56  Resp:  16 18 15   Temp: 98 F (36.7 C) 98.8 F (37.1 C) 98.2 F (36.8 C) 98.3 F (36.8 C)  TempSrc: Oral Oral Oral Oral  SpO2: 97% 91% 100% 96%  Weight:      Height:        General: Pt is alert, awake, not in acute distress,obese Cardiovascular: RRR, S1/S2 +, no rubs, no gallops Respiratory: CTA bilaterally, no wheezing, no rhonchi Abdominal: Soft, NT, ND, bowel sounds +, clean laparoscopic cholecystectomy wounds Extremities: no edema, no cyanosis    The results of significant diagnostics from this hospitalization (including imaging, microbiology, ancillary and laboratory) are listed below for reference.     Microbiology: Recent Results (from the past 240 hours)  Surgical pcr screen     Status: Abnormal   Collection Time: 10/25/23  8:50 PM   Specimen: Nasal Mucosa; Nasal Swab  Result Value Ref Range Status   MRSA, PCR NEGATIVE NEGATIVE Final   Staphylococcus aureus POSITIVE (Cheryl [name]) NEGATIVE Final    Comment: (NOTE) The Xpert SA Assay (FDA approved for NASAL specimens in patients 48 years of age and older), is one component of Cheryl [name] comprehensive surveillance program. It is not intended to diagnose infection nor to guide or monitor treatment. Performed at [facility], [address] [place], [place] [address]  Labs: BNP (last 3 results) No results for input(s): BNP in the last 8760 hours. Basic Metabolic Panel: Recent Labs  Lab 10/23/23 1929 10/23/23 2212 10/24/23 0247 10/25/23 0501 10/26/23 0436 10/27/23 0517  NA 140  --  139 137 139 142  K 4.3  --  3.9 3.5 4.0 4.0  CL 105  --  105 108 106 105  CO2 26  --  24 23 24 25   GLUCOSE 89  --  91 76 83 99  BUN 14  --  11 9 7 10   CREATININE 0.89   --  0.76 0.84 0.81 0.90  CALCIUM 9.0  --  8.5* 8.2* 8.7* 8.9  MG  --  2.0  --   --   --   --    Liver Function Tests: Recent Labs  Lab 10/23/23 1929 10/24/23 0247 10/25/23 0501 10/26/23 0436 10/27/23 0517  AST 151* 452* 72* 35 25  ALT 108* 254* 151* 114* 95*  ALKPHOS 264* 288* 215* 204* 192*  BILITOT 0.7 0.7 0.4 0.2 <0.2  PROT 7.6 6.5 6.2* 6.7 6.3*  ALBUMIN 3.3* 3.1* 3.0* 2.9* 3.4*   Recent Labs  Lab 10/23/23 1929  LIPASE 33   No results for input(s): AMMONIA in the last 168 hours. CBC: Recent Labs  Lab 10/23/23 1929 10/24/23 0247 10/25/23 0517 10/26/23 0436 10/27/23 0517  WBC 6.1 5.3 5.5 7.3 9.2  HGB 10.5* 9.8* 9.8* 10.0* 9.8*  HCT 36.0 33.7* 32.6* 32.9* 32.8*  MCV 84.9 85.1 85.1 86.1 85.4  PLT 363 300 330 373 366   Cardiac Enzymes: No results for input(s): CKTOTAL, CKMB, CKMBINDEX, TROPONINI in the last 168 hours. BNP: Invalid input(s): POCBNP CBG: No results for input(s): GLUCAP in the last 168 hours. D-Dimer No results for input(s): DDIMER in the last 72 hours. Hgb A1c No results for input(s): HGBA1C in the last 72 hours. Lipid Profile No results for input(s): CHOL, HDL, LDLCALC, TRIG, CHOLHDL, LDLDIRECT in the last 72 hours. Thyroid function studies No results for input(s): TSH, T4TOTAL, T3FREE, THYROIDAB in the last 72 hours.  Invalid input(s): FREET3 Anemia work up No results for input(s): VITAMINB12, FOLATE, FERRITIN, TIBC, IRON, RETICCTPCT in the last 72 hours. Urinalysis    Component Value Date/Time   COLORURINE YELLOW 10/23/2023 1858   APPEARANCEUR HAZY (Cheryl [name]) 10/23/2023 1858   LABSPEC 1.024 10/23/2023 1858   PHURINE 5.0 10/23/2023 1858   GLUCOSEU NEGATIVE 10/23/2023 1858   HGBUR NEGATIVE 10/23/2023 1858   BILIRUBINUR NEGATIVE 10/23/2023 1858   BILIRUBINUR negative 08/02/2023 1039   KETONESUR NEGATIVE 10/23/2023 1858   PROTEINUR NEGATIVE 10/23/2023 1858   UROBILINOGEN 0.2 08/02/2023 1039    UROBILINOGEN 1.0 06/06/2013 0640   NITRITE NEGATIVE 10/23/2023 1858   LEUKOCYTESUR NEGATIVE 10/23/2023 1858   Sepsis Labs Recent Labs  Lab 10/24/23 0247 10/25/23 0517 10/26/23 0436 10/27/23 0517  WBC 5.3 5.5 7.3 9.2   Microbiology Recent Results (from the past 240 hours)  Surgical pcr screen     Status: Abnormal   Collection Time: 10/25/23  8:50 PM   Specimen: Nasal Mucosa; Nasal Swab  Result Value Ref Range Status   MRSA, PCR NEGATIVE NEGATIVE Final   Staphylococcus aureus POSITIVE (Cheryl [name]) NEGATIVE Final    Comment: (NOTE) The Xpert SA Assay (FDA approved for NASAL specimens in patients 75 years of age and older), is one component of Cheryl [name] comprehensive surveillance program. It is not intended to diagnose infection nor to guide or monitor treatment. Performed at [facility], [address] [place], [place] [address]     Please note: You were cared for by Cheryl [name] hospitalist during your hospital stay. Once you are discharged, your primary care physician will handle any further medical issues. Please note that NO REFILLS for any discharge medications will be authorized once you are discharged, as it is imperative that you return to your primary care physician (or establish Cheryl [name] relationship with Cheryl [name] primary care physician if you do not have one) for your post hospital discharge needs so that they can reassess your need for medications and monitor your lab values.    Time coordinating discharge: 40 minutes  SIGNED:   Ivonne Mustache, MD  [facility] 10/27/2023, 10:09 AM Pager [phone]  If 7PM-7AM, please contact night-coverage [email or web address] Password [email or web address]

## 2023-10-27 NOTE — Progress Notes (Signed)
    1 Day Post-Op  Subjective: Having some soreness and pain with deep inspiration.  Otherwise pain is well controlled.  She has mobilized to the bathroom.  Tolerating a diet with no nausea  Objective: Vital signs in last 24 hours: Temp:  [97.8 F (36.6 C)-98.8 F (37.1 C)] 98.3 F (36.8 C) (08/27 0656) Pulse Rate:  [46-65] 56 (08/27 0656) Resp:  [12-22] 15 (08/27 0656) BP: (111-159)/(69-96) 159/87 (08/27 0656) SpO2:  [91 %-100 %] 96 % (08/27 0656) Last BM Date : 10/24/23  Intake/Output from previous day: 08/26 0701 - 08/27 0700 In: 2660 [P.O.:960; I.V.:1700] Out: 320 [Urine:300; Blood:20] Intake/Output this shift: No intake/output data recorded.  PE: Gen: NAD, morbidly obese, lying in bed Abd: soft, obese, appropriately tender. Incisions are c/d/i  Lab Results:  Recent Labs    10/26/23 0436 10/27/23 0517  WBC 7.3 9.2  HGB 10.0* 9.8*  HCT 32.9* 32.8*  PLT 373 366   BMET Recent Labs    10/26/23 0436 10/27/23 0517  NA 139 142  K 4.0 4.0  CL 106 105  CO2 24 25  GLUCOSE 83 99  BUN 7 10  CREATININE 0.81 0.90  CALCIUM 8.7* 8.9   PT/INR No results for input(s): LABPROT, INR in the last 72 hours.  CMP     Component Value Date/Time   NA 142 10/27/2023 0517   NA 140 03/18/2023 0953   K 4.0 10/27/2023 0517   CL 105 10/27/2023 0517   CO2 25 10/27/2023 0517   GLUCOSE 99 10/27/2023 0517   BUN 10 10/27/2023 0517   BUN 9 03/18/2023 0953   CREATININE 0.90 10/27/2023 0517   CALCIUM 8.9 10/27/2023 0517   PROT 6.3 (L) 10/27/2023 0517   PROT 7.0 03/18/2023 0953   ALBUMIN 3.4 (L) 10/27/2023 0517   ALBUMIN 4.0 03/18/2023 0953   AST 25 10/27/2023 0517   ALT 95 (H) 10/27/2023 0517   ALKPHOS 192 (H) 10/27/2023 0517   BILITOT <0.2 10/27/2023 0517   BILITOT <0.2 03/18/2023 0953   GFRNONAA >60 10/27/2023 0517   GFRAA >60 04/24/2018 1108   Lipase     Component Value Date/Time   LIPASE 33 10/23/2023 1929       Studies/Results: No results  found.   Anti-infectives: Anti-infectives (From admission, onward)    Start     Dose/Rate Route Frequency Ordered Stop   10/26/23 0900  cefTRIAXone  (ROCEPHIN ) 2 g in sodium chloride  0.9 % 100 mL IVPB        2 g 200 mL/hr over 30 Minutes Intravenous On call to O.R. 10/25/23 1449 10/26/23 1000   10/25/23 0600  ampicillin -sulbactam (UNASYN ) 1.5 g in sodium chloride  0.9 % 100 mL IVPB  Status:  Discontinued        1.5 g 200 mL/hr over 30 Minutes Intravenous On call to O.R. 10/24/23 2103 10/25/23 1447        Assessment/Plan POD 1, s/p lap chole by Dr. Curvin for Cholelithiasis, choledocholithiasis -doing well post operatively -tolerating a diet -pain controled with tramadol  -has mobilized some -surgically stable to DC home -AVS instructions, Rx, etc have been arranged and d/w primary  FEN - regular VTE - SCDs ID - none currently  Morbid obesity, BMI 60.65 Anxiety/depression    LOS: 2 days    Burnard FORBES Banter , [facility] 10/27/2023, 9:46 AM Please see Amion for pager number during day hours 7:00am-4:30pm or 7:00am -11:30am on weekends

## 2023-10-27 NOTE — Plan of Care (Signed)
  Problem: Education: Goal: Knowledge of General Education information will improve return to baseline Description: Including pain rating scale, medication(s)/side effects and non-pharmacologic comfort measures Outcome: Progressing   Problem: Health Behavior/Discharge Planning: Goal: Ability to manage health-related needs will improve Outcome: Progressing   Problem: Clinical Measurements: Goal: Ability to maintain clinical measurements within normal limits will improve Outcome: Progressing Goal: Will remain free from infection Outcome: Progressing Goal: Diagnostic test results will improve Outcome: Progressing Goal: Respiratory complications will improve Outcome: Progressing Goal: Cardiovascular complication will be avoided Outcome: Progressing   Problem: Activity: Goal: Risk for activity intolerance will decrease Outcome: Progressing   Problem: Nutrition: Goal: Adequate nutrition will be maintained Outcome: Progressing   Problem: Coping: Goal: Level of anxiety will decrease Outcome: Progressing   Problem: Elimination: Goal: Will not experience complications related to bowel motility Outcome: Progressing Goal: Will not experience complications related to urinary retention Outcome: Progressing   Problem: Pain Managment: Goal: General experience of comfort will improve and/or be controlled Outcome: Progressing   Problem: Safety: Goal: Ability to remain free from injury will improve Outcome: Progressing   Problem: Skin Integrity: Goal: Risk for impaired skin integrity will decrease Outcome: Progressing

## 2023-10-27 NOTE — Progress Notes (Signed)
 Food Information systems manager mailed to patient

## 2023-10-28 ENCOUNTER — Other Ambulatory Visit (HOSPITAL_COMMUNITY): Payer: Self-pay

## 2023-11-04 ENCOUNTER — Encounter (HOSPITAL_BASED_OUTPATIENT_CLINIC_OR_DEPARTMENT_OTHER): Payer: Self-pay | Admitting: Family Medicine

## 2023-11-04 ENCOUNTER — Ambulatory Visit (HOSPITAL_BASED_OUTPATIENT_CLINIC_OR_DEPARTMENT_OTHER): Admitting: Family Medicine

## 2023-11-04 VITALS — BP 140/97 | HR 79 | Ht 63.0 in | Wt 343.0 lb

## 2023-11-04 DIAGNOSIS — D500 Iron deficiency anemia secondary to blood loss (chronic): Secondary | ICD-10-CM

## 2023-11-04 DIAGNOSIS — Z9049 Acquired absence of other specified parts of digestive tract: Secondary | ICD-10-CM | POA: Diagnosis not present

## 2023-11-04 LAB — COMPREHENSIVE METABOLIC PANEL WITH GFR
ALT: 20 IU/L (ref 0–32)
AST: 14 IU/L (ref 0–40)
Albumin: 4.1 g/dL (ref 3.9–4.9)
Alkaline Phosphatase: 125 IU/L — ABNORMAL HIGH (ref 44–121)
BUN/Creatinine Ratio: 10 (ref 9–23)
BUN: 9 mg/dL (ref 6–20)
Bilirubin Total: <0.2 mg/dL (ref 0.0–1.2)
CO2: 21 mmol/L (ref 20–29)
Calcium: 9.3 mg/dL (ref 8.7–10.2)
Chloride: 101 mmol/L (ref 96–106)
Creatinine, Ser: 0.93 mg/dL (ref 0.57–1.00)
Globulin, Total: 3.1 g/dL (ref 1.5–4.5)
Glucose: 89 mg/dL (ref 70–99)
Potassium: 4.7 mmol/L (ref 3.5–5.2)
Sodium: 138 mmol/L (ref 134–144)
Total Protein: 7.2 g/dL (ref 6.0–8.5)
eGFR: 83 mL/min/1.73 (ref >59)

## 2023-11-04 LAB — CBC WITH DIFFERENTIAL/PLATELET
Basophils Absolute: 0.1 x10E3/uL (ref 0.0–0.2)
Basos: 1 %
EOS (ABSOLUTE): 0.4 x10E3/uL (ref 0.0–0.4)
Eos: 6 %
Hematocrit: 38.8 % (ref 34.0–46.6)
Hemoglobin: 11.7 g/dL (ref 11.1–15.9)
Immature Grans (Abs): 0 x10E3/uL (ref 0.0–0.1)
Immature Granulocytes: 0 %
Lymphocytes Absolute: 2.2 x10E3/uL (ref 0.7–3.1)
Lymphs: 36 %
MCH: 25.1 pg — ABNORMAL LOW (ref 26.6–33.0)
MCHC: 30.2 g/dL — ABNORMAL LOW (ref 31.5–35.7)
MCV: 83 fL (ref 79–97)
Monocytes Absolute: 0.5 x10E3/uL (ref 0.1–0.9)
Monocytes: 9 %
Neutrophils Absolute: 2.8 x10E3/uL (ref 1.4–7.0)
Neutrophils: 48 %
Platelets: 416 x10E3/uL (ref 150–450)
RBC: 4.66 x10E6/uL (ref 3.77–5.28)
RDW: 17.5 % — ABNORMAL HIGH (ref 11.7–15.4)
WBC: 5.9 x10E3/uL (ref 3.4–10.8)

## 2023-11-04 NOTE — Patient Instructions (Signed)
 IRON RICH FOODS:  Beef, pork, chicken, [place], scallops, shrimp, salmon, eggs, canned chunk like tuna, grains, fish eggs.  Peanut butter, beans (black, kidney, lima, pinto), lentils, tofu, hummus, dark leafy green vegetables (current greens, kale, broccoli), oatmeal.  Iron enriched breads, pastas, and cereals.

## 2023-11-04 NOTE — Progress Notes (Signed)
 Subjective:   Cheryl [name] May 03, 1990 11/04/2023  Chief Complaint  Patient presents with   Hospitalization Follow-up    Pt states she is doing better after hospital stay. Has had some mild pain today and did take pain medication which has helped. Also has had some mild nausea. Denies any other concerns.      HPI: Cheryl [name] presents today for re-assessment and management of chronic medical conditions.   Patient is s/p laparoscopic cholecystectomy on 10/26/2023 She is taking pain medication as needed. She is mild nausea, but no vomiting. She is having bowel movements every day. Her appetite is improving and slowly progressing her diet . She has a follow up with Dr. Wilhelmenia on 11/18/2023.    IDA:  Cheryl [name] presents for the medical management of iron deficiency anemia due to chronic blood loss. Reports her periods have improved in the past several months and are not as heavy. She is not currently on any birth control for regulation of periods.  Current medication : None; she is not taking any additional iron supplementation currently  Denies bloody stools, hematuria, excessive fatigue, palpitations, pica.  Patient is  seeing hematology for management.     The following portions of the patient's history were reviewed and updated as appropriate: past medical history, past surgical history, family history, social history, allergies, medications, and problem list.   Patient Active Problem List   Diagnosis Date Noted   Elevated LFTs 10/26/2023   Choledocholithiasis 10/23/2023   Prediabetes 03/25/2023   Iron deficiency anemia due to chronic blood loss 03/25/2023   Vitamin D  deficiency 03/25/2023   Elevated blood pressure reading 03/25/2023   Biliary colic    OBESITY 01/02/2008   IRREGULAR MENSTRUAL CYCLE 01/02/2008   Past Medical History:  Diagnosis Date   Allergy 2018   Anemia    Anxiety    Depression    Family history of adverse reaction to anesthesia     Per pt, grandmother couldn't go under with certain medications, unclear on specific complication   Gall stones    Past Surgical History:  Procedure Laterality Date   CHOLECYSTECTOMY N/A 10/26/2023   Procedure: LAPAROSCOPIC CHOLECYSTECTOMY;  Surgeon: Curvin Deward MOULD, MD;  Location: [facility];  Service: General;  Laterality: N/A;   ERCP N/A 10/25/2023   Procedure: ERCP, WITH INTERVENTION IF INDICATED;  Surgeon: Abran Norleen SAILOR, MD;  Location: [facility];  Service: Gastroenterology;  Laterality: N/A;  Apparently procedure requested to be done at 1 pm.   Family History  Problem Relation Age of Onset   Depression Mother    Diabetes Mother    Obesity Mother    Arthritis Mother    Diabetes Maternal Grandmother    Heart disease Maternal Grandmother    Obesity Maternal Grandmother    Arthritis Maternal Grandmother    Stroke Maternal Grandfather    Liver disease Neg Hx    Esophageal cancer Neg Hx    Colon cancer Neg Hx    Outpatient Medications Prior to Visit  Medication Sig Dispense Refill   acetaminophen  (TYLENOL ) 500 MG tablet Take 2 tablets (1,000 mg total) by mouth every 6 (six) hours as needed.     ibuprofen  (ADVIL ) 200 MG tablet Take 3 tablets (600 mg total) by mouth every 6 (six) hours as needed.     traMADol  (ULTRAM ) 50 MG tablet Take 1 tablet (50 mg total) by mouth every 6 (six) hours as needed (mild pain). 15 tablet 0   No  facility-administered medications prior to visit.   Allergies  Allergen Reactions   Shellfish Allergy Shortness Of Breath   Latex Itching and Rash    Powder inside the glove     ROS: A complete ROS was performed with pertinent positives/negatives noted in the HPI. The remainder of the ROS are negative.    Objective:   Today's Vitals   11/04/23 1039  BP: (!) 140/97  Pulse: 79  SpO2: 98%  Weight: (!) 343 lb (155.6 kg)  Height: 5' 3 (1.6 m)    Physical Exam   GENERAL: Well-appearing, in NAD. Well nourished.  SKIN: Pink, warm and dry. No rash,  lesion, ulceration, or ecchymoses.  Head: Normocephalic. NECK: Trachea midline. Full ROM w/o pain or tenderness.  RESPIRATORY: Chest wall symmetrical. Respirations even and non-labored. GI: Abdomen soft, non-tender. Incision sites without erythema or drainage.  Normoactive bowel sounds. No rebound tenderness. No hepatomegaly or splenomegaly. No CVA tenderness.  MSK: Muscle tone and strength appropriate for age.  NEUROLOGIC: No motor or sensory deficits. Steady, even gait. C2-C12 intact.  PSYCH/MENTAL STATUS: Alert, oriented x 3. Cooperative, appropriate mood and affect.   Health Maintenance Due  Topic Date Due   Hepatitis B Vaccines 19-59 Average Risk (1 of 3 - 19+ 3-dose series) Never done   HPV VACCINES (1 - 3-dose SCDM series) Never done   COVID-19 Vaccine (4 - 2025-26 season) 11/01/2023      Assessment & Plan:  1. S/P laparoscopic cholecystectomy (Primary) Incision sites healing well. Will repeat CBC and CMP with labs today for follow up. She will continue f/u scheduled with surgeon. Discussed diet progression.  - CBC with Differential/Platelet - Comprehensive metabolic panel with GFR  2. Iron deficiency anemia due to chronic blood loss Will check CBC, iron panel today. Pending result, may recommend OCP and/or iron supplementation.  - CBC with Differential/Platelet - Iron and TIBC - Ferritin   No orders of the defined types were placed in this encounter.  Lab Orders         CBC with Differential/Platelet         Comprehensive metabolic panel with GFR         Iron and TIBC         Ferritin      Return for 1 week BP Check only; 4 months (after 1/23) ANNUAL PHYSICAL, Follow up IDA.    Patient to reach out to office if new, worrisome, or unresolved symptoms arise or if no improvement in patient's condition. Patient verbalized understanding and is agreeable to treatment plan. All questions answered to patient's satisfaction.    Thersia Schuyler Stark, [place]

## 2023-11-05 LAB — IRON AND TIBC
Iron Saturation: 7 % — CL (ref 15–55)
Iron: 25 ug/dL — ABNORMAL LOW (ref 27–159)
Total Iron Binding Capacity: 381 ug/dL (ref 250–450)
UIBC: 356 ug/dL (ref 131–425)

## 2023-11-05 LAB — FERRITIN: Ferritin: 15 ng/mL (ref 15–150)

## 2023-11-08 ENCOUNTER — Ambulatory Visit (HOSPITAL_BASED_OUTPATIENT_CLINIC_OR_DEPARTMENT_OTHER): Payer: Self-pay | Admitting: Family Medicine

## 2023-11-08 NOTE — Progress Notes (Signed)
 Hi Cheryl [name], Your hemoglobin and hematocrit have improved, however your MCH, MCHC, RDW, iron and iron saturation are still low.  I would recommend an iron supplement at this time.  I believe we talked about possibly trying Accrufer which is an oral iron supplement to help with iron absorption.  Please let me know if you would like to try this.  Your liver enzymes have improved significantly since gallbladder removal.  Please let me know if you have any questions

## 2023-11-11 ENCOUNTER — Ambulatory Visit (INDEPENDENT_AMBULATORY_CARE_PROVIDER_SITE_OTHER): Admitting: *Deleted

## 2023-11-11 VITALS — BP 126/82

## 2023-11-11 DIAGNOSIS — R030 Elevated blood-pressure reading, without diagnosis of hypertension: Secondary | ICD-10-CM

## 2023-11-11 NOTE — Progress Notes (Signed)
 Patient came in today for a BP recheck. BP checked twice and both readings have been documented.

## 2023-11-15 ENCOUNTER — Encounter (HOSPITAL_COMMUNITY): Payer: Self-pay

## 2023-11-15 ENCOUNTER — Ambulatory Visit (HOSPITAL_COMMUNITY): Admit: 2023-11-15 | Admitting: Gastroenterology

## 2023-11-15 SURGERY — ERCP, WITH INTERVENTION IF INDICATED
Anesthesia: General

## 2024-03-27 ENCOUNTER — Encounter (HOSPITAL_BASED_OUTPATIENT_CLINIC_OR_DEPARTMENT_OTHER): Admitting: Family Medicine

## 2024-04-06 ENCOUNTER — Encounter (HOSPITAL_COMMUNITY): Payer: Self-pay | Admitting: Emergency Medicine

## 2024-04-06 ENCOUNTER — Emergency Department (HOSPITAL_COMMUNITY)
Admission: EM | Admit: 2024-04-06 | Discharge: 2024-04-07 | Disposition: A | Payer: Self-pay | Source: Home / Self Care | Attending: Emergency Medicine | Admitting: Emergency Medicine

## 2024-04-06 ENCOUNTER — Other Ambulatory Visit: Payer: Self-pay

## 2024-04-06 ENCOUNTER — Emergency Department (HOSPITAL_COMMUNITY): Payer: Self-pay

## 2024-04-06 DIAGNOSIS — J101 Influenza due to other identified influenza virus with other respiratory manifestations: Secondary | ICD-10-CM

## 2024-04-06 LAB — RESP PANEL BY RT-PCR (RSV, FLU A&B, COVID)  RVPGX2
Influenza A by PCR: NEGATIVE
Influenza B by PCR: POSITIVE — AB
Resp Syncytial Virus by PCR: NEGATIVE
SARS Coronavirus 2 by RT PCR: NEGATIVE

## 2024-04-06 LAB — CBC WITH DIFFERENTIAL/PLATELET
Abs Immature Granulocytes: 0.01 10*3/uL (ref 0.00–0.07)
Basophils Absolute: 0 10*3/uL (ref 0.0–0.1)
Basophils Relative: 1 %
Eosinophils Absolute: 0.1 10*3/uL (ref 0.0–0.5)
Eosinophils Relative: 3 %
HCT: 43.6 % (ref 36.0–46.0)
Hemoglobin: 13.9 g/dL (ref 12.0–15.0)
Immature Granulocytes: 0 %
Lymphocytes Relative: 51 %
Lymphs Abs: 1.9 10*3/uL (ref 0.7–4.0)
MCH: 26.6 pg (ref 26.0–34.0)
MCHC: 31.9 g/dL (ref 30.0–36.0)
MCV: 83.5 fL (ref 80.0–100.0)
Monocytes Absolute: 0.5 10*3/uL (ref 0.1–1.0)
Monocytes Relative: 12 %
Neutro Abs: 1.2 10*3/uL — ABNORMAL LOW (ref 1.7–7.7)
Neutrophils Relative %: 33 %
Platelets: 318 10*3/uL (ref 150–400)
RBC: 5.22 MIL/uL — ABNORMAL HIGH (ref 3.87–5.11)
RDW: 17.9 % — ABNORMAL HIGH (ref 11.5–15.5)
Smear Review: NORMAL
WBC: 3.6 10*3/uL — ABNORMAL LOW (ref 4.0–10.5)
nRBC: 0 % (ref 0.0–0.2)

## 2024-04-06 LAB — COMPREHENSIVE METABOLIC PANEL WITH GFR
ALT: 35 U/L (ref 0–44)
AST: 33 U/L (ref 15–41)
Albumin: 4.1 g/dL (ref 3.5–5.0)
Alkaline Phosphatase: 82 U/L (ref 38–126)
Anion gap: 14 (ref 5–15)
BUN: 12 mg/dL (ref 6–20)
CO2: 26 mmol/L (ref 22–32)
Calcium: 8.9 mg/dL (ref 8.9–10.3)
Chloride: 96 mmol/L — ABNORMAL LOW (ref 98–111)
Creatinine, Ser: 1.2 mg/dL — ABNORMAL HIGH (ref 0.44–1.00)
GFR, Estimated: >60 mL/min
Glucose, Bld: 119 mg/dL — ABNORMAL HIGH (ref 70–99)
Potassium: 4.1 mmol/L (ref 3.5–5.1)
Sodium: 136 mmol/L (ref 135–145)
Total Bilirubin: 0.3 mg/dL (ref 0.0–1.2)
Total Protein: 8.2 g/dL — ABNORMAL HIGH (ref 6.5–8.1)

## 2024-04-06 LAB — HCG, SERUM, QUALITATIVE: Preg, Serum: NEGATIVE

## 2024-04-06 MED ORDER — ACETAMINOPHEN 325 MG PO TABS
650.0000 mg | ORAL_TABLET | Freq: Once | ORAL | Status: AC
  Administered 2024-04-06: 650 mg via ORAL
  Filled 2024-04-06: qty 2

## 2024-04-06 NOTE — ED Triage Notes (Signed)
 Patent has been coughing, body aches, nasal congestion about 6 days ago. She has had fever as well.   She states that she feels dizzy and lightheaded.

## 2024-04-06 NOTE — ED Triage Notes (Addendum)
 Patient c/o flu like symptoms for a few days.  Patient also endorses SI for too long.  Patient denies plan.

## 2024-04-06 NOTE — ED Provider Triage Note (Signed)
 Emergency Medicine Provider Triage Evaluation Note  Cheryl [name] , a 34 y.o. female  was evaluated in triage.  Pt complains of body aches, cough (productive for the past few days), congestion, fevers, decreased food intake for the past 2 days due to loss of appetite. Exposed to someone with similar symptoms. Symptoms started 12/19, not as severe, resolved and came back a few days ago. Has taken cough medicine for symptoms today.   + SI report to triage nurse, patient states this is ongoing for years, does not see any one with BH currently. Not actively suicidal, no plan.   Review of Systems  Positive: Cough, congestion, body aches, fever Negative:   Physical Exam  BP 121/79   Pulse 98   Temp 99.2 F (37.3 C) (Oral)   Resp 20   Wt (!) 156 kg   SpO2 94%   BMI 60.92 kg/m  Gen:   Awake, no distress   Resp:  Normal effort  MSK:   Moves extremities without difficulty  Other:    Medical Decision Making  Medically screening exam initiated at 8:38 PM.  Appropriate orders placed.  Gilberte A Wickham was informed that the remainder of the evaluation will be completed by another provider, this initial triage assessment does not replace that evaluation, and the importance of remaining in the ED until their evaluation is complete.     Beverley Leita LABOR, PA-C 04/06/24 2043

## 2024-04-07 MED ORDER — BENZONATATE 100 MG PO CAPS: 100.0000 mg | ORAL_CAPSULE | Freq: Three times a day (TID) | ORAL | 0 refills | Status: AC

## 2024-04-07 MED ORDER — BENZONATATE 100 MG PO CAPS
200.0000 mg | ORAL_CAPSULE | Freq: Once | ORAL | Status: AC
  Administered 2024-04-07: 200 mg via ORAL
  Filled 2024-04-07: qty 2

## 2024-04-07 MED ORDER — OSELTAMIVIR PHOSPHATE 75 MG PO CAPS: 75.0000 mg | ORAL_CAPSULE | Freq: Two times a day (BID) | ORAL | 0 refills | Status: AC

## 2024-04-07 NOTE — ED Provider Notes (Signed)
 " [facility] Emergency Department Provider Note MRN:  [id number]  Arrival date & time: 04/07/24     Chief Complaint   Influenza and Suicidal   History of Present Illness   Cheryl [name] is a 34 y.o. year-old female presents to the ED with chief complaint of flulike symptoms.  She states that she has been sick for about a week.  She complains of generalized bodyaches and cough and some nausea.  She denies any vomiting.  Denies measured fever.  Denies chills.  She states that she has tried taking over-the-counter cough and cold medicine without much relief.  She denies any other new or associated symptoms.  In triage patient states that she has suffered with chronic suicidality, but does not have a plan, nothing is different tonight.  History provided by patient.   Review of Systems  Pertinent positive and negative review of systems noted in HPI.    Physical Exam   Vitals:   04/07/24 0052 04/07/24 0455  BP: 111/79 112/80  Pulse: (!) 101 90  Resp: 16 20  Temp: 98.8 F (37.1 C) 98.4 F (36.9 C)  SpO2: 97% 97%    CONSTITUTIONAL:  non toxic-appearing, NAD NEURO:  Alert and oriented x 3, CN 3-12 grossly intact EYES:  eyes equal and reactive ENT/NECK:  Supple, no stridor, mildly erythematous oropharynx CARDIO:  normal rate on my exam, regular rhythm, appears well-perfused  PULM:  No respiratory distress, CTAB GI/GU:  non-distended, no focal abdominal tenderness MSK/SPINE:  No gross deformities, no edema, moves all extremities  SKIN:  no rash,  atraumatic   *Additional and/or pertinent findings included in MDM below  Diagnostic and Interventional Summary    EKG Interpretation Date/Time:    Ventricular Rate:    PR Interval:    QRS Duration:    QT Interval:    QTC Calculation:   R Axis:      Text Interpretation:         Labs Reviewed  RESP PANEL BY RT-PCR (RSV, FLU A&B, COVID)  RVPGX2 - Abnormal; Notable for the following components:       Result Value   Influenza B by PCR POSITIVE (*)    All other components within normal limits  CBC WITH DIFFERENTIAL/PLATELET - Abnormal; Notable for the following components:   WBC 3.6 (*)    RBC 5.22 (*)    RDW 17.9 (*)    Neutro Abs 1.2 (*)    All other components within normal limits  COMPREHENSIVE METABOLIC PANEL WITH GFR - Abnormal; Notable for the following components:   Chloride 96 (*)    Glucose, Bld 119 (*)    Creatinine, Ser 1.20 (*)    Total Protein 8.2 (*)    All other components within normal limits  HCG, SERUM, QUALITATIVE  URINALYSIS, ROUTINE W REFLEX MICROSCOPIC    DG Chest 2 View  Final Result      Medications  acetaminophen  (TYLENOL ) tablet 650 mg (650 mg Oral Given 04/06/24 2102)     Procedures  /  Critical Care Procedures  ED Course and Medical Decision Making  I have reviewed the triage vital signs, the nursing notes, and pertinent available records from the EMR.  Social Determinants Affecting Complexity of Care: Patient has no clinically significant social determinants affecting this chief complaint..   ED Course:    Medical Decision Making Patient presenting with flulike symptoms that started about a week ago.  She states that she is still having generalized bodyaches.  We discussed that she is outside of the treatment window for Tamiflu , but she still would like to try this.  I discussed with her that it would likely not do anything.  Will prescribe her some Tessalon  Perles for her cough.  I have advised her to continue resting, drink plenty of fluids.  Will give her a note for work for the next few days.  She appears stable for discharge.  There was some chronic suicidality commented on in triage, she does not report any active suicidal thoughts or plan.  I believe her to be low risk for outpatient follow-up.         Consultants: No consultations were needed in caring for this patient.   Treatment and Plan: I considered admission due to  patient's initial presentation, but after considering the examination and diagnostic results, patient will not require admission and can be discharged with outpatient follow-up.    Final Clinical Impressions(s) / ED Diagnoses     ICD-10-CM   1. Influenza B  J10.1       ED Discharge Orders          Ordered    benzonatate  (TESSALON ) 100 MG capsule  Every 8 hours        04/07/24 0536    oseltamivir  (TAMIFLU ) 75 MG capsule  Every 12 hours        04/07/24 0536              Discharge Instructions Discussed with and Provided to Patient:   Discharge Instructions   None      Vicky Charleston, PA-C 04/07/24 0536    Theadore Ozell HERO, MD 04/07/24 [phone]  "

## 2024-04-07 NOTE — ED Provider Notes (Signed)
 Provider Suicide Risk Assessment Note  Nursing Documentation C-SSRS RISK CATEGORY: Low Risk  ED Suicide Bundle Interventions (Must be higher or equal to C-SSRS Score): Low Risk Interventions implemented   Suicide Risk Assessment:   Based on my clinical evaluation, I estimate the patient to be at chronic low risk of self-harm in the current setting. This decision is based on my review of the chart including patient's history and current presentation, interview of the patient, mental status examination, and consideration of suicide risk including evaluating suicidal ideation, plan, intent, suicidal or self-harm behaviors, risk factors, and protective factors.  Patient has following modifiable risk factors for suicide: social isolation Patient has following non-modifiable or demographic risk factors for suicide: Patient has the following protective factors against suicide: no history of suicide attempts found in chart Mitigation: Risk for suicide is being addressed by recommendations for: outpatient consult/treatment (low risk)      Vicky Charleston, PA-C 04/07/24 0554    Theadore Ozell HERO, MD 04/07/24 [phone]

## 2024-05-31 ENCOUNTER — Encounter (HOSPITAL_BASED_OUTPATIENT_CLINIC_OR_DEPARTMENT_OTHER): Admitting: Family Medicine
